# Patient Record
Sex: Female | Born: 1940 | Race: White | Hispanic: No | Marital: Married | Smoking: Former smoker
Health system: Southern US, Community
[De-identification: ages and names within clinical notes are randomized; demographics above are authoritative.]

## PROBLEM LIST (undated history)

## (undated) DIAGNOSIS — F32A Depression, unspecified: Secondary | ICD-10-CM

## (undated) DIAGNOSIS — I10 Essential (primary) hypertension: Secondary | ICD-10-CM

## (undated) DIAGNOSIS — E7800 Pure hypercholesterolemia, unspecified: Secondary | ICD-10-CM

## (undated) DIAGNOSIS — F419 Anxiety disorder, unspecified: Secondary | ICD-10-CM

## (undated) DIAGNOSIS — T7840XA Allergy, unspecified, initial encounter: Secondary | ICD-10-CM

## (undated) HISTORY — DX: Anxiety disorder, unspecified: F41.9

## (undated) HISTORY — PX: TONSILLECTOMY: SUR1361

## (undated) HISTORY — PX: JOINT REPLACEMENT: SHX530

## (undated) HISTORY — PX: CHOLECYSTECTOMY: SHX55

## (undated) HISTORY — DX: Allergy, unspecified, initial encounter: T78.40XA

## (undated) HISTORY — PX: TOTAL HIP ARTHROPLASTY: SHX124

---

## 2019-08-19 ENCOUNTER — Other Ambulatory Visit (HOSPITAL_COMMUNITY): Payer: Self-pay | Admitting: Family Medicine

## 2019-08-19 DIAGNOSIS — Z780 Asymptomatic menopausal state: Secondary | ICD-10-CM

## 2019-08-19 DIAGNOSIS — Z1231 Encounter for screening mammogram for malignant neoplasm of breast: Secondary | ICD-10-CM

## 2019-09-12 ENCOUNTER — Ambulatory Visit (HOSPITAL_COMMUNITY): Payer: Medicare HMO

## 2019-09-15 ENCOUNTER — Ambulatory Visit (HOSPITAL_COMMUNITY)
Admission: RE | Admit: 2019-09-15 | Discharge: 2019-09-15 | Disposition: A | Discharge status: Home / Self Care | Payer: Medicare HMO | Source: Ambulatory Visit | Attending: Family Medicine | Admitting: Family Medicine

## 2019-09-15 ENCOUNTER — Other Ambulatory Visit: Payer: Self-pay

## 2019-09-15 ENCOUNTER — Encounter (HOSPITAL_COMMUNITY): Payer: Self-pay

## 2019-09-15 DIAGNOSIS — Z1231 Encounter for screening mammogram for malignant neoplasm of breast: Secondary | ICD-10-CM | POA: Insufficient documentation

## 2019-09-15 DIAGNOSIS — Z780 Asymptomatic menopausal state: Secondary | ICD-10-CM | POA: Diagnosis present

## 2019-10-25 ENCOUNTER — Inpatient Hospital Stay
Admission: RE | Admit: 2019-10-25 | Discharge: 2019-10-25 | Disposition: A | Discharge status: Home / Self Care | Payer: Self-pay | Source: Ambulatory Visit | Attending: Family Medicine | Admitting: Family Medicine

## 2019-10-25 ENCOUNTER — Other Ambulatory Visit (HOSPITAL_COMMUNITY): Payer: Self-pay | Admitting: Family Medicine

## 2019-10-25 DIAGNOSIS — Z1231 Encounter for screening mammogram for malignant neoplasm of breast: Secondary | ICD-10-CM

## 2020-06-04 DIAGNOSIS — F419 Anxiety disorder, unspecified: Secondary | ICD-10-CM | POA: Diagnosis not present

## 2020-06-04 DIAGNOSIS — M19041 Primary osteoarthritis, right hand: Secondary | ICD-10-CM | POA: Diagnosis not present

## 2020-06-04 DIAGNOSIS — M25511 Pain in right shoulder: Secondary | ICD-10-CM | POA: Diagnosis not present

## 2020-09-14 ENCOUNTER — Emergency Department (HOSPITAL_COMMUNITY)
Admission: EM | Admit: 2020-09-14 | Discharge: 2020-09-14 | Disposition: A | Discharge status: Home / Self Care | Payer: Medicare HMO | Attending: Emergency Medicine | Admitting: Emergency Medicine

## 2020-09-14 ENCOUNTER — Emergency Department (HOSPITAL_COMMUNITY): Payer: Medicare HMO

## 2020-09-14 ENCOUNTER — Other Ambulatory Visit: Payer: Self-pay

## 2020-09-14 ENCOUNTER — Encounter (HOSPITAL_COMMUNITY): Payer: Self-pay | Admitting: *Deleted

## 2020-09-14 DIAGNOSIS — Z87891 Personal history of nicotine dependence: Secondary | ICD-10-CM | POA: Insufficient documentation

## 2020-09-14 DIAGNOSIS — R079 Chest pain, unspecified: Secondary | ICD-10-CM | POA: Diagnosis not present

## 2020-09-14 DIAGNOSIS — R0789 Other chest pain: Secondary | ICD-10-CM | POA: Insufficient documentation

## 2020-09-14 DIAGNOSIS — I10 Essential (primary) hypertension: Secondary | ICD-10-CM | POA: Diagnosis not present

## 2020-09-14 HISTORY — DX: Essential (primary) hypertension: I10

## 2020-09-14 HISTORY — DX: Depression, unspecified: F32.A

## 2020-09-14 HISTORY — DX: Pure hypercholesterolemia, unspecified: E78.00

## 2020-09-14 LAB — CBC WITH DIFFERENTIAL/PLATELET
Abs Immature Granulocytes: 0.03 10*3/uL (ref 0.00–0.07)
Basophils Absolute: 0.1 10*3/uL (ref 0.0–0.1)
Basophils Relative: 1 %
Eosinophils Absolute: 0.2 10*3/uL (ref 0.0–0.5)
Eosinophils Relative: 3 %
HCT: 44.4 % (ref 36.0–46.0)
Hemoglobin: 14.5 g/dL (ref 12.0–15.0)
Immature Granulocytes: 0 %
Lymphocytes Relative: 19 %
Lymphs Abs: 1.5 10*3/uL (ref 0.7–4.0)
MCH: 33 pg (ref 26.0–34.0)
MCHC: 32.7 g/dL (ref 30.0–36.0)
MCV: 100.9 fL — ABNORMAL HIGH (ref 80.0–100.0)
Monocytes Absolute: 0.8 10*3/uL (ref 0.1–1.0)
Monocytes Relative: 10 %
Neutro Abs: 5.4 10*3/uL (ref 1.7–7.7)
Neutrophils Relative %: 67 %
Platelets: 316 10*3/uL (ref 150–400)
RBC: 4.4 MIL/uL (ref 3.87–5.11)
RDW: 12.8 % (ref 11.5–15.5)
WBC: 8.1 10*3/uL (ref 4.0–10.5)
nRBC: 0 % (ref 0.0–0.2)

## 2020-09-14 LAB — COMPREHENSIVE METABOLIC PANEL
ALT: 22 U/L (ref 0–44)
AST: 18 U/L (ref 15–41)
Albumin: 3.9 g/dL (ref 3.5–5.0)
Alkaline Phosphatase: 60 U/L (ref 38–126)
Anion gap: 7 (ref 5–15)
BUN: 13 mg/dL (ref 8–23)
CO2: 27 mmol/L (ref 22–32)
Calcium: 9.5 mg/dL (ref 8.9–10.3)
Chloride: 105 mmol/L (ref 98–111)
Creatinine, Ser: 0.87 mg/dL (ref 0.44–1.00)
GFR, Estimated: >60 mL/min (ref >60)
Glucose, Bld: 109 mg/dL — ABNORMAL HIGH (ref 70–99)
Potassium: 3.9 mmol/L (ref 3.5–5.1)
Sodium: 139 mmol/L (ref 135–145)
Total Bilirubin: 0.4 mg/dL (ref 0.3–1.2)
Total Protein: 7 g/dL (ref 6.5–8.1)

## 2020-09-14 LAB — TROPONIN I (HIGH SENSITIVITY)
Troponin I (High Sensitivity): 3 ng/L (ref <18)
Troponin I (High Sensitivity): 3 ng/L (ref <18)

## 2020-09-14 LAB — LIPASE, BLOOD: Lipase: 23 U/L (ref 11–51)

## 2020-09-14 NOTE — ED Triage Notes (Signed)
Pt c/o mid to left sided chest pain x 1 week. Denies any new SOB since the chest pain started. Pain is intermittent.

## 2020-09-14 NOTE — ED Notes (Signed)
X-ray at bedside

## 2020-09-14 NOTE — Discharge Instructions (Addendum)
Follow-up with Dr. Wyline Mood in the next week or 2.  Continue taking your aspirin and return if any problems prior to that

## 2020-09-14 NOTE — ED Provider Notes (Signed)
[facility] EMERGENCY DEPARTMENT Provider Note   CSN: [id number] Arrival date & time: 09/14/20  0749     History Chief Complaint  Patient presents with   Chest Pain    Gina Salazar is a 80 y.o. female.  Patient states for the last day she has been having episodes of left-sided chest discomfort lasting just a few seconds more of a stinging pain.  She is not having any pain now.  It is not related to any exertion  The history is provided by the patient. No language interpreter was used.  Chest Pain Pain location:  L chest Pain quality: aching   Pain radiates to:  Does not radiate Pain severity:  Mild Onset quality:  Sudden Duration: 15 seconds. Timing:  Intermittent Progression:  Unchanged Chronicity:  New Context: not breathing   Associated symptoms: no abdominal pain, no back pain, no cough, no fatigue and no headache       Past Medical History:  Diagnosis Date   Depression    High cholesterol    Hypertension     There are no problems to display for this patient.   Past Surgical History:  Procedure Laterality Date   CHOLECYSTECTOMY     TONSILLECTOMY       OB History   No obstetric history on file.     Family History  Problem Relation Age of Onset   Breast cancer Maternal Aunt    Breast cancer Maternal Uncle     Social History   Tobacco Use   Smoking status: Former    Years: 4.00    Pack years: 0.00    Types: Cigarettes   Smokeless tobacco: Never  Vaping Use   Vaping Use: Never used  Substance Use Topics   Alcohol use: Never   Drug use: Never    Home Medications Prior to Admission medications   Not on File    Allergies    Patient has no known allergies.  Review of Systems   Review of Systems  Constitutional:  Negative for appetite change and fatigue.  HENT:  Negative for congestion, ear discharge and sinus pressure.   Eyes:  Negative for discharge.  Respiratory:  Negative for cough.   Cardiovascular:  Positive for chest  pain.  Gastrointestinal:  Negative for abdominal pain and diarrhea.  Genitourinary:  Negative for frequency and hematuria.  Musculoskeletal:  Negative for back pain.  Skin:  Negative for rash.  Neurological:  Negative for seizures and headaches.  Psychiatric/Behavioral:  Negative for hallucinations.    Physical Exam Updated Vital Signs BP 127/68   Pulse 79   Temp 99.3 F (37.4 C) (Oral)   Resp 17   Ht 5\' 3"  (1.6 m)   Wt 98.9 kg   SpO2 96%   BMI 38.62 kg/m   Physical Exam Vitals and nursing note reviewed.  Constitutional:      Appearance: She is well-developed.  HENT:     Head: Normocephalic.     Nose: Nose normal.  Eyes:     General: No scleral icterus.    Conjunctiva/sclera: Conjunctivae normal.  Neck:     Thyroid: No thyromegaly.  Cardiovascular:     Rate and Rhythm: Normal rate and regular rhythm.     Heart sounds: No murmur heard.   No friction rub. No gallop.  Pulmonary:     Breath sounds: No stridor. No wheezing or rales.  Chest:     Chest wall: No tenderness.  Abdominal:     General: There  is no distension.     Tenderness: There is no abdominal tenderness. There is no rebound.  Musculoskeletal:        General: Normal range of motion.     Cervical back: Neck supple.  Lymphadenopathy:     Cervical: No cervical adenopathy.  Skin:    Findings: No erythema or rash.  Neurological:     Mental Status: She is alert and oriented to person, place, and time.     Motor: No abnormal muscle tone.     Coordination: Coordination normal.  Psychiatric:        Behavior: Behavior normal.    ED Results / Procedures / Treatments   Labs (all labs ordered are listed, but only abnormal results are displayed) Labs Reviewed  CBC WITH DIFFERENTIAL/PLATELET - Abnormal; Notable for the following components:      Result Value   MCV 100.9 (*)    All other components within normal limits  COMPREHENSIVE METABOLIC PANEL - Abnormal; Notable for the following components:    Glucose, Bld 109 (*)    All other components within normal limits  LIPASE, BLOOD  TROPONIN I (HIGH SENSITIVITY)  TROPONIN I (HIGH SENSITIVITY)    EKG EKG Interpretation  Date/Time:  Friday September 14 2020 08:00:05 EDT Ventricular Rate:  91 PR Interval:  159 QRS Duration: 84 QT Interval:  344 QTC Calculation: 424 R Axis:   -13 Text Interpretation: Sinus rhythm Consider left atrial enlargement Low voltage, precordial leads Consider anterior infarct Confirmed by Bethann Berkshire [phone]) on 09/14/2020 9:09:45 AM  Radiology DG Chest Port 1 View  Result Date: 09/14/2020 CLINICAL DATA:  Pain EXAM: PORTABLE CHEST 1 VIEW COMPARISON:  None. FINDINGS: Mild, likely chronic interstitial changes. No consolidation or edema. No pleural effusion. No pneumothorax. Cardiomediastinal contours are within normal limits with normal heart size. IMPRESSION: No acute process in the chest. Electronically Signed   By: Guadlupe Spanish M.D.   On: 09/14/2020 08:32    Procedures Procedures   Medications Ordered in ED Medications - No data to display  ED Course  I have reviewed the triage vital signs and the nursing notes.  Pertinent labs & imaging results that were available during my care of the patient were reviewed by me and considered in my medical decision making (see chart for details).    MDM Rules/Calculators/A&P                         Patient with atypical chest pain.  EKG and troponins chest x-ray and the rest of her labs are all unremarkable.  I do not believe this is coronary artery disease.  Most likely it is a peripheral nervous inflamed.  Her pain only lasts for a few seconds when she gets it.  Patient has been referred to cardiology for evaluation Final Clinical Impression(s) / ED Diagnoses Final diagnoses:  Atypical chest pain    Rx / DC Orders ED Discharge Orders     None        Bethann Berkshire, MD 09/14/20 1115

## 2020-09-26 DIAGNOSIS — F419 Anxiety disorder, unspecified: Secondary | ICD-10-CM | POA: Diagnosis not present

## 2020-09-26 DIAGNOSIS — E785 Hyperlipidemia, unspecified: Secondary | ICD-10-CM | POA: Diagnosis not present

## 2020-09-26 DIAGNOSIS — F3341 Major depressive disorder, recurrent, in partial remission: Secondary | ICD-10-CM | POA: Diagnosis not present

## 2020-09-26 DIAGNOSIS — I10 Essential (primary) hypertension: Secondary | ICD-10-CM | POA: Diagnosis not present

## 2020-10-11 ENCOUNTER — Encounter: Payer: Self-pay | Admitting: Cardiology

## 2020-10-11 ENCOUNTER — Ambulatory Visit: Payer: Medicare HMO | Admitting: Cardiology

## 2020-10-11 ENCOUNTER — Other Ambulatory Visit: Payer: Self-pay

## 2020-10-11 VITALS — BP 110/70 | HR 92 | Ht 63.0 in | Wt 216.0 lb

## 2020-10-11 DIAGNOSIS — R0789 Other chest pain: Secondary | ICD-10-CM

## 2020-10-11 NOTE — Progress Notes (Signed)
     Clinical Summary Gina Salazar is a 80 y.o.female seen today as a new patient for the following medical problems  Chest pain - from ER notes thought to be atypical chest pain - trops neg x 2, EKG NSR no ischemic changes - CXR no acute process  - pain midchest x 1 week. Moderate pressing like feeling. Would occur at rest or with exertion. No other associated symptoms. Would last 1-2 seconds. Not positional. Few times a day.  - no recurrence since ER visit - sedentary lifestyle, does mild housework. Chronic SOB with activities. Limited by leg weakness  CAD risk factors: HTN, hyperlipidemia, former smoker x 6 years.    Past Medical History:  Diagnosis Date   Depression    High cholesterol    Hypertension      No Known Allergies   No current outpatient medications on file.   No current facility-administered medications for this visit.     Past Surgical History:  Procedure Laterality Date   CHOLECYSTECTOMY     TONSILLECTOMY       No Known Allergies    Family History  Problem Relation Age of Onset   Breast cancer Maternal Aunt    Breast cancer Maternal Uncle      Social History Gina Salazar reports that she has quit smoking. Her smoking use included cigarettes. She has never used smokeless tobacco. Gina Salazar reports no history of alcohol use.   Review of Systems CONSTITUTIONAL: No weight loss, fever, chills, weakness or fatigue.  HEENT: Eyes: No visual loss, blurred vision, double vision or yellow sclerae.No hearing loss, sneezing, congestion, runny nose or sore throat.  SKIN: No rash or itching.  CARDIOVASCULAR: per hpi RESPIRATORY: No shortness of breath, cough or sputum.  GASTROINTESTINAL: No anorexia, nausea, vomiting or diarrhea. No abdominal pain or blood.  GENITOURINARY: No burning on urination, no polyuria NEUROLOGICAL: No headache, dizziness, syncope, paralysis, ataxia, numbness or tingling in the extremities. No change in bowel or bladder  control.  MUSCULOSKELETAL: No muscle, back pain, joint pain or stiffness.  LYMPHATICS: No enlarged nodes. No history of splenectomy.  PSYCHIATRIC: No history of depression or anxiety.  ENDOCRINOLOGIC: No reports of sweating, cold or heat intolerance. No polyuria or polydipsia.  Marland Kitchen   Physical Examination Today's Vitals   10/11/20 0906  BP: 110/70  Pulse: 92  SpO2: 96%  Weight: 216 lb (98 kg)  Height: 5\' 3"  (1.6 m)   Body mass index is 38.26 kg/m.  Gen: resting comfortably, no acute distress HEENT: no scleral icterus, pupils equal round and reactive, no palptable cervical adenopathy,  CV: RRR, no m/r/g, no jvd Resp: Clear to auscultation bilaterally GI: abdomen is soft, non-tender, non-distended, normal bowel sounds, no hepatosplenomegaly MSK: extremities are warm, no edema.  Skin: warm, no rash Neuro:  no focal deficits Psych: appropriate affect   Assessment and Plan  Chest pain -noncardiac chest pain that has resolved. Negative ER workup - no plans for additional cardiac testing at this time - continue risk factor modificiation  May f/u as needed        June, M.D.

## 2020-10-11 NOTE — Patient Instructions (Signed)
Medication Instructions:  Your physician recommends that you continue on your current medications as directed. Please refer to the Current Medication list given to you today.  *If you need a refill on your cardiac medications before your next appointment, please call your pharmacy*   Lab Work: None If you have labs (blood work) drawn today and your tests are completely normal, you will receive your results only by: MyChart Message (if you have MyChart) OR A paper copy in the mail If you have any lab test that is abnormal or we need to change your treatment, we will call you to review the results.   Testing/Procedures: None   Follow-Up: At [facility], you and your health needs are our priority.  As part of our continuing mission to provide you with exceptional heart care, we have created designated Provider Care Teams.  These Care Teams include your primary Cardiologist (physician) and Advanced Practice Providers (APPs -  Physician Assistants and Nurse Practitioners) who all work together to provide you with the care you need, when you need it.  We recommend signing up for the patient portal called "MyChart".  Sign up information is provided on this After Visit Summary.  MyChart is used to connect with patients for Virtual Visits (Telemedicine).  Patients are able to view lab/test results, encounter notes, upcoming appointments, etc.  Non-urgent messages can be sent to your provider as well.   To learn more about what you can do with MyChart, go to [email or web address].    Your next appointment:   Follow Up: AS NEEDED   Other Instructions    

## 2020-12-12 DIAGNOSIS — I10 Essential (primary) hypertension: Secondary | ICD-10-CM | POA: Diagnosis not present

## 2020-12-12 DIAGNOSIS — E559 Vitamin D deficiency, unspecified: Secondary | ICD-10-CM | POA: Diagnosis not present

## 2020-12-12 DIAGNOSIS — E782 Mixed hyperlipidemia: Secondary | ICD-10-CM | POA: Diagnosis not present

## 2020-12-13 LAB — LAB REPORT - SCANNED: EGFR: 64

## 2020-12-26 DIAGNOSIS — I10 Essential (primary) hypertension: Secondary | ICD-10-CM | POA: Diagnosis not present

## 2020-12-26 DIAGNOSIS — F3341 Major depressive disorder, recurrent, in partial remission: Secondary | ICD-10-CM | POA: Diagnosis not present

## 2020-12-26 DIAGNOSIS — E6609 Other obesity due to excess calories: Secondary | ICD-10-CM | POA: Diagnosis not present

## 2020-12-26 DIAGNOSIS — E785 Hyperlipidemia, unspecified: Secondary | ICD-10-CM | POA: Diagnosis not present

## 2021-03-27 ENCOUNTER — Other Ambulatory Visit (HOSPITAL_COMMUNITY): Payer: Self-pay | Admitting: Family Medicine

## 2021-03-27 DIAGNOSIS — M79644 Pain in right finger(s): Secondary | ICD-10-CM | POA: Diagnosis not present

## 2021-03-27 DIAGNOSIS — M810 Age-related osteoporosis without current pathological fracture: Secondary | ICD-10-CM | POA: Diagnosis not present

## 2021-03-27 DIAGNOSIS — Z1231 Encounter for screening mammogram for malignant neoplasm of breast: Secondary | ICD-10-CM

## 2021-03-27 DIAGNOSIS — E559 Vitamin D deficiency, unspecified: Secondary | ICD-10-CM | POA: Diagnosis not present

## 2021-03-27 DIAGNOSIS — Z9181 History of falling: Secondary | ICD-10-CM | POA: Diagnosis not present

## 2021-04-17 ENCOUNTER — Other Ambulatory Visit: Payer: Self-pay

## 2021-04-17 ENCOUNTER — Ambulatory Visit (HOSPITAL_COMMUNITY)
Admission: RE | Admit: 2021-04-17 | Discharge: 2021-04-17 | Disposition: A | Discharge status: Home / Self Care | Payer: Medicare HMO | Source: Ambulatory Visit | Attending: Family Medicine | Admitting: Family Medicine

## 2021-04-17 DIAGNOSIS — Z1231 Encounter for screening mammogram for malignant neoplasm of breast: Secondary | ICD-10-CM | POA: Insufficient documentation

## 2021-07-24 DIAGNOSIS — F3341 Major depressive disorder, recurrent, in partial remission: Secondary | ICD-10-CM | POA: Diagnosis not present

## 2021-07-24 DIAGNOSIS — F419 Anxiety disorder, unspecified: Secondary | ICD-10-CM | POA: Diagnosis not present

## 2021-07-24 DIAGNOSIS — E785 Hyperlipidemia, unspecified: Secondary | ICD-10-CM | POA: Diagnosis not present

## 2021-07-24 DIAGNOSIS — I10 Essential (primary) hypertension: Secondary | ICD-10-CM | POA: Diagnosis not present

## 2021-11-20 ENCOUNTER — Other Ambulatory Visit (HOSPITAL_COMMUNITY): Payer: Self-pay | Admitting: Nurse Practitioner

## 2021-11-20 DIAGNOSIS — M810 Age-related osteoporosis without current pathological fracture: Secondary | ICD-10-CM | POA: Diagnosis not present

## 2021-11-20 DIAGNOSIS — Z780 Asymptomatic menopausal state: Secondary | ICD-10-CM

## 2021-11-20 DIAGNOSIS — N393 Stress incontinence (female) (male): Secondary | ICD-10-CM | POA: Diagnosis not present

## 2021-11-20 DIAGNOSIS — Z9181 History of falling: Secondary | ICD-10-CM | POA: Diagnosis not present

## 2021-11-20 DIAGNOSIS — F419 Anxiety disorder, unspecified: Secondary | ICD-10-CM | POA: Diagnosis not present

## 2021-11-25 ENCOUNTER — Ambulatory Visit (HOSPITAL_COMMUNITY)
Admission: RE | Admit: 2021-11-25 | Discharge: 2021-11-25 | Disposition: A | Discharge status: Home / Self Care | Payer: Medicare HMO | Source: Ambulatory Visit | Attending: Nurse Practitioner | Admitting: Nurse Practitioner

## 2021-11-25 DIAGNOSIS — M810 Age-related osteoporosis without current pathological fracture: Secondary | ICD-10-CM | POA: Diagnosis not present

## 2021-11-25 DIAGNOSIS — Z780 Asymptomatic menopausal state: Secondary | ICD-10-CM | POA: Diagnosis not present

## 2021-11-25 DIAGNOSIS — M85852 Other specified disorders of bone density and structure, left thigh: Secondary | ICD-10-CM | POA: Diagnosis not present

## 2021-12-13 DIAGNOSIS — E782 Mixed hyperlipidemia: Secondary | ICD-10-CM | POA: Diagnosis not present

## 2021-12-13 DIAGNOSIS — I10 Essential (primary) hypertension: Secondary | ICD-10-CM | POA: Diagnosis not present

## 2021-12-13 DIAGNOSIS — E559 Vitamin D deficiency, unspecified: Secondary | ICD-10-CM | POA: Diagnosis not present

## 2021-12-18 ENCOUNTER — Ambulatory Visit (HOSPITAL_COMMUNITY): Payer: Medicare HMO | Attending: Family Medicine

## 2021-12-18 DIAGNOSIS — M25552 Pain in left hip: Secondary | ICD-10-CM | POA: Insufficient documentation

## 2021-12-18 DIAGNOSIS — R262 Difficulty in walking, not elsewhere classified: Secondary | ICD-10-CM | POA: Diagnosis not present

## 2021-12-18 DIAGNOSIS — R2689 Other abnormalities of gait and mobility: Secondary | ICD-10-CM | POA: Diagnosis not present

## 2021-12-18 NOTE — Therapy (Signed)
OUTPATIENT PHYSICAL THERAPY LOWER EXTREMITY EVALUATION   Patient Name: Gina Salazar MRN: [id number] DOB:10/30/1940, 81 y.o., female Today's Date: 12/18/2021   PT End of Session - 12/18/21 1014     Visit Number 1    Number of Visits 6    Date for PT Re-Evaluation 01/29/22    Authorization Type [employer] Medicare HMO, no VL; no auth    Progress Note Due on Visit 10    PT Start Time 1014    PT Stop Time 1100    PT Time Calculation (min) 46 min             Past Medical History:  Diagnosis Date   Depression    High cholesterol    Hypertension    Past Surgical History:  Procedure Laterality Date   CHOLECYSTECTOMY     TONSILLECTOMY     There are no problems to display for this patient.   PCP:  John Giovanni, MD     REFERRING PROVIDER: PT eval/tx for hx of falling, to improve gait mobility and strength, lt hip pain per Dillard Essex, NP   REFERRING DIAG: PT eval/tx for hx of falling, to improve gait mobility and strength, lt hip pain per Dillard Essex, NP   THERAPY DIAG:  Difficulty in walking, not elsewhere classified  Other abnormalities of gait and mobility  Pain in left hip  Rationale for Evaluation and Treatment Rehabilitation  ONSET DATE: years  SUBJECTIVE:   SUBJECTIVE STATEMENT: My biggest issue is balance. Moved here from [email or web address]; my hips hurt; don't want to fall anymore; left hip pain limits prolonged standing or walking; has to cook in increments; sleep on that side  PERTINENT HISTORY: R THA 4 years ago Has some hand and shoulder pain  PAIN:  Are you having pain? Yes: NPRS scale: 4/10 Pain location: left hip Pain description: achey Aggravating factors: standing, walking prolonged period of time Relieving factors: Advil  PRECAUTIONS: None  WEIGHT BEARING RESTRICTIONS No  FALLS:  Has patient fallen in last 6 months? No  LIVING ENVIRONMENT: Lives with: lives with their spouse Lives in: House/apartment Stairs: Yes:  External: 2 steps; none Has following equipment at home: Single point cane, Environmental consultant - 4 wheeled, Tour manager, and Grab bars  OCCUPATION: retired  PLOF: Independent  PATIENT GOALS better balance   OBJECTIVE:   DIAGNOSTIC FINDINGS:   EXAM: DUAL X-RAY ABSORPTIOMETRY (DXA) FOR BONE MINERAL DENSITY   IMPRESSION: Your patient Gina Salazar completed a BMD test on 11/25/2021 using the [employer] DXA System (software version: 14.10) manufactured by [employer]. The following summarizes the results of our evaluation. Technologist: [facility] PATIENT BIOGRAPHICAL: Name: Gina, Salazar Patient ID: [id number] Birth Date: [birth year] Height: 63.0 in. Gender: Female Exam Date: 11/25/2021 Weight: 216.0 lbs. Indications: Caucasian, Follow up Osteoporosis, Low Calcium Intake, Post Menopausal Fractures: Treatments: Asprin, Fosamax, Vitamin D DENSITOMETRY RESULTS: Site         Region     Measured Date Measured Age WHO Classification Young Adult T-score BMD         %Change vs. Previous Significant Change (*) Left Femur Neck 13/25/[age] 78.9 Oste[age] -1.2 0.870 g/cm2 8.6% Yes Left Femur Neck 09[age]15/2019 76.7 Osteopenia -1.7 0.801 g/cm2 - -   Left Femur Total 11/25/2023 78.9 Osteopenia -1.2 0.856 g/cm2 9.2% Yes Left Femur Total 09/15/2019 76.7 Osteopenia -1.8 0.784 g/cm2 - -   Left Forearm Radius 33% 11/25/2023 78.9 Osteoporosis -3.0 0.497 g/cm2 4.6% - Left Forearm Radius 33% 09/15/2019 76.7 Osteoporosis -3.3 0.475 g/cm2 - -  ASSESSMENT: The BMD measured at Forearm Radius 33% is 0.497 g/cm2 with a T-score of -3.0. This patient is considered osteoporotic according to World Health Organization [facility]) criteria. The scan quality is good. Compared with the prior study on 09/15/19, the BMD of the lt. total hip shows a statistically significant increase. Lumbar spine was excluded due to advanced degenerative changes. Rt. hip excluded due to surgical repair.   World Environmental consultant [facility]) criteria for post-menopausal, Caucasian Women: Normal:       T-score at or above -1 SD Osteopenia:   T-score between -1 and -2.5 SD Osteoporosis: T-score at or below -2.5 SD   RECOMMENDATIONS: 1. All patients should optimize calcium and vitamin D intake. 2. Consider FDA-approved medical therapies in postmenopausal women and med aged 51 years and older, based on the following: a. A hip or vertebral (clinical or morphometric) fracture b. T-score< -2.5 at the femoral neck or spine after appropriate evaluation to exclude secondary causes c. Low bone mass (T-score between -1.0 and -2.5 at the femoral neck or spine) and a 10-year probability of a hip fracture > 3% or a 10-year probability of a major osteoporosis-related fracture > 20% based on the [place]-adapted WHO algorithm d. Clinician judgment and/or patient preferences may indicate treatment for people with 10-year fracture probabilities above or below these levels   FOLLOW-UP: Patients with diagnosis of osteoporosis or at high risk for fracture should have regular bone mineral density tests. For patients eligible for Medicare, routine testing is allowed once every 2 years. The testing frequency can be increased to one year for patients who have rapidly progressing disease, those who are receiving or discontinuing medical therapy to restore bone mass, or have additional risk factors.   I have reviewed this report, and agree with the above findings.   [facility]     PATIENT SURVEYS:  FOTO 52  COGNITION:  Overall cognitive status: Within functional limits for tasks assessed     SENSATION: Sometimes hands go to sleep at night   LOWER EXTREMITY MMT:  MMT Right eval Left eval  Hip flexion 4 4  Hip extension 4- 4-  Hip abduction 4- 4  Hip adduction    Hip internal rotation    Hip external rotation    Knee flexion 4+ 4  Knee extension 5 5  Ankle dorsiflexion 5 5  Ankle plantarflexion     Ankle inversion    Ankle eversion     (Blank rows = not tested)    FUNCTIONAL TESTS:  5 times sit to stand: 17.35 Timed up and go (TUG): not tested 2 minute walk test: 365 ft  GAIT: Distance walked: 365 ft Assistive device utilized: Single point cane Level of assistance: Modified independence Comments: R ankle instabilty    TODAY'S TREATMENT: Physical therapy evaluation and HEP instruction   PATIENT EDUCATION:  Education details: Patient educated on exam findings, POC, scope of PT, HEP. Person educated: Patient Education method: Explanation, Demonstration, and Handouts Education comprehension: verbalized understanding, returned demonstration, verbal cues required, and tactile cues required   HOME EXERCISE PROGRAM: Access Code: [email or web address] URL: [email or web address] Date: 12/18/2021 Prepared by: AP - Rehab  Exercises - Supine Bridge  - 2 x daily - 7 x weekly - 1 sets - 10 reps - Hooklying Clamshell with Resistance  - 2 x daily - 7 x weekly - 1 sets - 10 reps - Single Leg Stance with Support  - 2 x daily - 7 x weekly - 1 sets - 10  reps - 5-10 sec hold - Standing Tandem Balance with Counter Support  - 2 x daily - 7 x weekly - 1 sets - 10 reps - 5-10 sec hold  ASSESSMENT:  CLINICAL IMPRESSION: Patient is a 81 y.o. female who was seen today for physical therapy evaluation and treatment for PT eval/tx for hx of falling, to improve gait mobility and strength, lt hip pain per Carlis Abbott, NP . Patient presents on evaluation with weakness in her legs, impaired balance using a cane for ambulation, impaired perceived functional mobility per FOTO score and pain that limits tolerance for prolonged standing and walking all of which negatively impact her ability to stand to cook; to walk for fitness and shopping in community and disturb her sleep.  Patient will benefit from continued skilled therapy services  to address deficits and promote return to optimal function.        OBJECTIVE IMPAIRMENTS Abnormal gait, cardiopulmonary status limiting activity, decreased activity tolerance, decreased balance, decreased endurance, decreased knowledge of condition, decreased knowledge of use of DME, decreased mobility, difficulty walking, decreased ROM, decreased strength, hypomobility, increased fascial restrictions, impaired perceived functional ability, impaired flexibility, and pain.   ACTIVITY LIMITATIONS carrying, lifting, bending, standing, squatting, sleeping, stairs, transfers, bed mobility, continence, bathing, locomotion level, and caring for others  PARTICIPATION LIMITATIONS: meal prep, cleaning, laundry, driving, shopping, community activity, and yard work  [place], Past/current experiences, and Time since onset of injury/illness/exacerbation are also affecting patient's functional outcome.   REHAB POTENTIAL: Good  CLINICAL DECISION MAKING: Evolving/moderate complexity  EVALUATION COMPLEXITY: Moderate   GOALS: Goals reviewed with patient? No  SHORT TERM GOALS: Target date: 01/08/2022   patient will be independent with initial HEP  Baseline: Goal status: INITIAL  2.  Patient will improve 5 x STS score from 17.35 sec to 15 sec to demonstrate improved functional mobility and increased lower extremity strength.  Baseline:  Goal status: INITIAL    LONG TERM GOALS: Target date: 01/29/2022   Patient will be independent in self management strategies to improve quality of life and functional outcomes.  Baseline:  Goal status: INITIAL  2.  Patient will improve 5 x STS score from 17.23 sec to 13 sec to demonstrate improved functional mobility and increased lower extremity strength.  Baseline:  Goal status: INITIAL  3.  Patient will improve FOTO score to predicted value to demonstrate improved functional mobility  Baseline: 52 Goal status: INITIAL  4.  Patient will report at least 50% improvement in overall symptoms and/or  function to demonstrate improved functional mobility  Baseline:  Goal status: INITIAL  5.   Patient will increase bilateral lower extremity MMTs to 4+-5/5 to promote return to ambulation community distances with minimal deviation.   Baseline: see above Goal status: INITIAL  PLAN: PT FREQUENCY: 1x/week  PT DURATION: 6 weeks  PLANNED INTERVENTIONS: Therapeutic exercises, Therapeutic activity, Neuromuscular re-education, Balance training, Gait training, Patient/Family education, Joint manipulation, Joint mobilization, Stair training, Orthotic/Fit training, DME instructions, Aquatic Therapy, Dry Needling, Electrical stimulation, Spinal manipulation, Spinal mobilization, Cryotherapy, Moist heat, Compression bandaging, scar mobilization, Splintting, Taping, Traction, Ultrasound, Ionotophoresis 4mg /ml Dexamethasone, and Manual therapy  PLAN FOR NEXT SESSION: Review of HEP and goals.   11:04 AM, 12/18/21 Mendy Chou Small Jamaiyah Pyle MPT [facility] [place] [phone]

## 2021-12-20 DIAGNOSIS — Z9181 History of falling: Secondary | ICD-10-CM | POA: Diagnosis not present

## 2021-12-20 DIAGNOSIS — N393 Stress incontinence (female) (male): Secondary | ICD-10-CM | POA: Diagnosis not present

## 2021-12-20 DIAGNOSIS — M810 Age-related osteoporosis without current pathological fracture: Secondary | ICD-10-CM | POA: Diagnosis not present

## 2021-12-20 DIAGNOSIS — F419 Anxiety disorder, unspecified: Secondary | ICD-10-CM | POA: Diagnosis not present

## 2021-12-25 ENCOUNTER — Ambulatory Visit (HOSPITAL_COMMUNITY): Payer: Medicare HMO | Admitting: Physical Therapy

## 2021-12-25 DIAGNOSIS — M25552 Pain in left hip: Secondary | ICD-10-CM

## 2021-12-25 DIAGNOSIS — R262 Difficulty in walking, not elsewhere classified: Secondary | ICD-10-CM | POA: Diagnosis not present

## 2021-12-25 DIAGNOSIS — R2689 Other abnormalities of gait and mobility: Secondary | ICD-10-CM | POA: Diagnosis not present

## 2021-12-25 NOTE — Therapy (Signed)
OUTPATIENT PHYSICAL THERAPY LOWER EXTREMITY TREATMENT   Patient Name: Gina Salazar MRN: [id number] DOB:[birth year], 81 y.o., female Today's Date: 12/25/2021   PT End of Session - 12/25/21 1122     Visit Number 2    Number of Visits 6    Date for PT Re-Evaluation 01/29/22    Authorization Type [employer] Medicare HMO, no VL; no auth    Progress Note Due on Visit 10    PT Start Time 1117    PT Stop Time 1155    PT Time Calculation (min) 38 min    Activity Tolerance Patient tolerated treatment well    Behavior During Therapy WFL for tasks assessed/performed             Past Medical History:  Diagnosis Date   Depression    High cholesterol    Hypertension    Past Surgical History:  Procedure Laterality Date   CHOLECYSTECTOMY     TONSILLECTOMY     There are no problems to display for this patient.   PCP:  John Giovanni, MD     REFERRING PROVIDER: PT eval/tx for hx of falling, to improve gait mobility and strength, lt hip pain per Dillard Essex, NP   REFERRING DIAG: PT eval/tx for hx of falling, to improve gait mobility and strength, lt hip pain per Dillard Essex, NP   THERAPY DIAG:  Difficulty in walking, not elsewhere classified  Other abnormalities of gait and mobility  Pain in left hip  Rationale for Evaluation and Treatment Rehabilitation  ONSET DATE: years  SUBJECTIVE:   SUBJECTIVE STATEMENT: Right hip is hurting today. HEP was ok, did most days. Most trouble with tandem balance.   PERTINENT HISTORY: R THA 4 years ago Has some hand and shoulder pain  PAIN:  Are you having pain? Yes: NPRS scale: 3/10 Pain location: left hip Pain description: achey Aggravating factors: standing, walking prolonged period of time Relieving factors: Advil  PRECAUTIONS: None  WEIGHT BEARING RESTRICTIONS No  FALLS:  Has patient fallen in last 6 months? No  LIVING ENVIRONMENT: Lives with: lives with their spouse Lives in: House/apartment Stairs: Yes:  External: 2 steps; none Has following equipment at home: Single point cane, Environmental consultant - 4 wheeled, Tour manager, and Grab bars  OCCUPATION: retired  PLOF: Independent  PATIENT GOALS better balance   OBJECTIVE:   DIAGNOSTIC FINDINGS:   EXAM: DUAL X-RAY ABSORPTIOMETRY (DXA) FOR BONE MINERAL DENSITY   IMPRESSION: Your patient Gina Salazar completed a BMD test on 11/25/2021 using the [employer] DXA System (software version: 14.10) manufactured by [employer]. The following summarizes the results of our evaluation. Technologist: [facility] PATIENT BIOGRAPHICAL: Name: Gina, Salazar Patient ID: [id number] Birth Date: 1940/04/27 Height: 63.0 in. Gender: Female Exam Date: 11/25/2021 Weight: 216.0 lbs. Indications: Caucasian, Follow up Osteoporosis, Low Calcium Intake, Post Menopausal Fractures: Treatments: Asprin, Fosamax, Vitamin D DENSITOMETRY RESULTS: Site         Region     Measured Date Measured Age WHO Classification Young Adult T-score BMD         %Change vs. Previous Significant Change (*) Left Femur Neck 11/25/2023 78.9 Oste[age] -1.2 0.870 g/cm2 8.6% Yes Left Femur Neck 09/15/2019 76.7 Osteopenia -1.7 0.801 g/cm2 - -   Left Femur Total 11/25/2021 78.9 Osteopenia -1.2 0.856 g/cm2 9.2% Yes Left Femur Total 09/15/2019 76.7 Osteopenia -1.8 0.784 g/cm2 - -   Left Forearm Radius 33% 11/25/2021 78.9 Osteoporosis -3.0 0.497 g/cm2 4.6% - Left Forearm Radius 33% 09/15/2019 76.7 Osteoporosis -3.3 0.475  g/cm2 - - ASSESSMENT: The BMD measured at Forearm Radius 33% is 0.497 g/cm2 with a T-score of -3.0. This patient is considered osteoporotic according to World Health Organization [facility]) criteria. The scan quality is good. Compared with the prior study on 09/15/19, the BMD of the lt. total hip shows a statistically significant increase. Lumbar spine was excluded due to advanced degenerative changes. Rt. hip excluded due to surgical repair.   World Environmental consultant [facility]) criteria for post-menopausal, Caucasian Women: Normal:       T-score at or above -1 SD Osteopenia:   T-score between -1 and -2.5 SD Osteoporosis: T-score at or below -2.5 SD   RECOMMENDATIONS: 1. All patients should optimize calcium and vitamin D intake. 2. Consider FDA-approved medical therapies in postmenopausal women and med aged 19 years and older, based on the following: a. A hip or vertebral (clinical or morphometric) fracture b. T-score< -2.5 at the femoral neck or spine after appropriate evaluation to exclude secondary causes c. Low bone mass (T-score between -1.0 and -2.5 at the femoral neck or spine) and a 10-year probability of a hip fracture > 3% or a 10-year probability of a major osteoporosis-related fracture > 20% based on the [place]-adapted WHO algorithm d. Clinician judgment and/or patient preferences may indicate treatment for people with 10-year fracture probabilities above or below these levels   FOLLOW-UP: Patients with diagnosis of osteoporosis or at high risk for fracture should have regular bone mineral density tests. For patients eligible for Medicare, routine testing is allowed once every 2 years. The testing frequency can be increased to one year for patients who have rapidly progressing disease, those who are receiving or discontinuing medical therapy to restore bone mass, or have additional risk factors.   I have reviewed this report, and agree with the above findings.   [facility]     PATIENT SURVEYS:  FOTO 52  COGNITION:  Overall cognitive status: Within functional limits for tasks assessed     SENSATION: Sometimes hands go to sleep at night   LOWER EXTREMITY MMT:  MMT Right eval Left eval  Hip flexion 4 4  Hip extension 4- 4-  Hip abduction 4- 4  Hip adduction    Hip internal rotation    Hip external rotation    Knee flexion 4+ 4  Knee extension 5 5  Ankle dorsiflexion 5 5  Ankle plantarflexion     Ankle inversion    Ankle eversion     (Blank rows = not tested)    FUNCTIONAL TESTS:  5 times sit to stand: 17.35 Timed up and go (TUG): not tested 2 minute walk test: 365 ft  GAIT: Distance walked: 365 ft Assistive device utilized: Single point cane Level of assistance: Modified independence Comments: R ankle instabilty    TODAY'S TREATMENT: 12/25/21 Bridge x 10  Clamshell 2 x 10 each SLR x 10 each  Heel raise x 10 Sit to stand no UE 1 x 10 Tandem stance 20 sec each Single leg stance 10 sec each    Physical therapy evaluation and HEP instruction   PATIENT EDUCATION:  Education details: Patient educated on exam findings, POC, scope of PT, HEP. Person educated: Patient Education method: Explanation, Demonstration, and Handouts Education comprehension: verbalized understanding, returned demonstration, verbal cues required, and tactile cues required   HOME EXERCISE PROGRAM: Access Code: [email or web address]  - Active Straight Leg Raise with Quad Set  - 2 x daily - 7 x weekly - 2 sets - 10 reps -  Clamshell  - 2 x daily - 7 x weekly - 2 sets - 10 reps - Heel Raises with Counter Support  - 2 x daily - 7 x weekly - 2 sets - 10 reps - Sit to Stand Without Arm Support  - 2 x daily - 7 x weekly - 1-2 sets - 10 reps  URL: [email or web address] Date: 12/18/2021 Prepared by: [facility] - Rehab  Exercises - Supine Bridge  - 2 x daily - 7 x weekly - 1 sets - 10 reps - Hooklying Clamshell with Resistance  - 2 x daily - 7 x weekly - 1 sets - 10 reps - Single Leg Stance with Support  - 2 x daily - 7 x weekly - 1 sets - 10 reps - 5-10 sec hold - Standing Tandem Balance with Counter Support  - 2 x daily - 7 x weekly - 1 sets - 10 reps - 5-10 sec hold  ASSESSMENT:  CLINICAL IMPRESSION: Reviewed goals and HEP. Patient tolerated session well today with minimal complaint. Most challenged with single leg balance. Progressed hip strengthening activity in sidelying and in standing.  Patient with good return overall, but did require verbal cues for correct form in full sidelying during clamshell activity. Updated and issued HEP handout. Patient will continue to benefit from skilled therapy services to reduce remaining deficits and improve functional ability.    OBJECTIVE IMPAIRMENTS Abnormal gait, cardiopulmonary status limiting activity, decreased activity tolerance, decreased balance, decreased endurance, decreased knowledge of condition, decreased knowledge of use of DME, decreased mobility, difficulty walking, decreased ROM, decreased strength, hypomobility, increased fascial restrictions, impaired perceived functional ability, impaired flexibility, and pain.   ACTIVITY LIMITATIONS carrying, lifting, bending, standing, squatting, sleeping, stairs, transfers, bed mobility, continence, bathing, locomotion level, and caring for others  PARTICIPATION LIMITATIONS: meal prep, cleaning, laundry, driving, shopping, community activity, and yard work  PERSONAL FACTORS Fitness, Past/current experiences, and Time since onset of injury/illness/exacerbation are also affecting patient's functional outcome.   REHAB POTENTIAL: Good  CLINICAL DECISION MAKING: Evolving/moderate complexity  EVALUATION COMPLEXITY: Moderate   GOALS: Goals reviewed with patient? Yes  SHORT TERM GOALS: Target date: 01/08/2022   patient will be independent with initial HEP  Baseline: Goal status: INITIAL  2.  Patient will improve 5 x STS score from 17.35 sec to 15 sec to demonstrate improved functional mobility and increased lower extremity strength.  Baseline:  Goal status: INITIAL    LONG TERM GOALS: Target date: 01/29/2022   Patient will be independent in self management strategies to improve quality of life and functional outcomes.  Baseline:  Goal status: INITIAL  2.  Patient will improve 5 x STS score from 17.23 sec to 13 sec to demonstrate improved functional mobility and increased  lower extremity strength.  Baseline:  Goal status: INITIAL  3.  Patient will improve FOTO score to predicted value to demonstrate improved functional mobility  Baseline: 52 Goal status: INITIAL  4.  Patient will report at least 50% improvement in overall symptoms and/or function to demonstrate improved functional mobility  Baseline:  Goal status: INITIAL  5.   Patient will increase bilateral lower extremity MMTs to 4+-5/5 to promote return to ambulation community distances with minimal deviation.  Baseline: see above Goal status: INITIAL  PLAN: PT FREQUENCY: 1x/week  PT DURATION: 6 weeks  PLANNED INTERVENTIONS: Therapeutic exercises, Therapeutic activity, Neuromuscular re-education, Balance training, Gait training, Patient/Family education, Joint manipulation, Joint mobilization, Stair training, Orthotic/Fit training, DME instructions, Aquatic Therapy, Dry Needling, Electrical stimulation, Spinal manipulation, Spinal  mobilization, Cryotherapy, Moist heat, Compression bandaging, scar mobilization, Splintting, Taping, Traction, Ultrasound, Ionotophoresis 4mg /ml Dexamethasone, and Manual therapy  PLAN FOR NEXT SESSION: Progress hip and core strengthening as tolerated.   11:22 AM, 12/25/21 Josue Hector PT DPT  Physical Therapist with [facility]  [phone]

## 2022-01-07 ENCOUNTER — Ambulatory Visit (HOSPITAL_COMMUNITY): Payer: Medicare HMO | Attending: Family Medicine

## 2022-01-07 ENCOUNTER — Encounter (HOSPITAL_COMMUNITY): Payer: Self-pay

## 2022-01-07 DIAGNOSIS — R2689 Other abnormalities of gait and mobility: Secondary | ICD-10-CM | POA: Diagnosis not present

## 2022-01-07 DIAGNOSIS — M25552 Pain in left hip: Secondary | ICD-10-CM | POA: Diagnosis not present

## 2022-01-07 DIAGNOSIS — R262 Difficulty in walking, not elsewhere classified: Secondary | ICD-10-CM | POA: Insufficient documentation

## 2022-01-07 NOTE — Therapy (Signed)
OUTPATIENT PHYSICAL THERAPY LOWER EXTREMITY TREATMENT   Patient Name: Gina Salazar MRN: [id number] DOB:07/09/1940, 81 y.o., female Today's Date: 01/07/2022   PT End of Session - 01/07/22 1247     Visit Number 3    Number of Visits 6    Date for PT Re-Evaluation 01/29/22    Authorization Type Aetna Medicare HMO, no VL; no auth    Progress Note Due on Visit 10    PT Start Time 0834    PT Stop Time 0920    PT Time Calculation (min) 46 min    Activity Tolerance Patient tolerated treatment well    Behavior During Therapy WFL for tasks assessed/performed              Past Medical History:  Diagnosis Date   Depression    High cholesterol    Hypertension    Past Surgical History:  Procedure Laterality Date   CHOLECYSTECTOMY     TONSILLECTOMY     There are no problems to display for this patient.   PCP:  Gina Andrea, MD     REFERRING PROVIDER: PT eval/tx for hx of falling, to improve gait mobility and strength, lt hip pain per Gina Abbott, NP   REFERRING DIAG: PT eval/tx for hx of falling, to improve gait mobility and strength, lt hip pain per Gina Abbott, NP   THERAPY DIAG:  Difficulty in walking, not elsewhere classified  Other abnormalities of gait and mobility  Pain in left hip  Rationale for Evaluation and Treatment Rehabilitation  ONSET DATE: years  SUBJECTIVE:   SUBJECTIVE STATEMENT: Pt stated she has completed her HEP about every other day.  Most difficulty with balance activities, no report of recent falls.  Does report some intermittent dull ache in lower back.  Does require some rest breaks during functional activities like cooking.  No reports of hip pain today.  PERTINENT HISTORY: R THA 4 years ago Has some hand and shoulder pain  PAIN:  Are you having pain? Yes: NPRS scale: 3/10 Pain location: left hip Pain description: achey Aggravating factors: standing, walking prolonged period of time Relieving factors:  Advil  PRECAUTIONS: None  WEIGHT BEARING RESTRICTIONS No  FALLS:  Has patient fallen in last 6 months? No  LIVING ENVIRONMENT: Lives with: lives with their spouse Lives in: House/apartment Stairs: Yes: External: 2 steps; none Has following equipment at home: Single point cane, Environmental consultant - 4 wheeled, Electronics engineer, and Grab bars  OCCUPATION: retired  PLOF: Independent  PATIENT GOALS better balance   OBJECTIVE:   DIAGNOSTIC FINDINGS:   EXAM: DUAL X-RAY ABSORPTIOMETRY (DXA) FOR BONE MINERAL DENSITY   IMPRESSION: Your patient Gina Salazar completed a BMD test on 11/25/2021 using the [employer] (software version: 14.10) manufactured by [employer]. The following summarizes the results of our evaluation. Technologist: Gina Salazar PATIENT BIOGRAPHICAL: Name: Gina Salazar Patient ID: [id number] Birth Date: 02-20-1941 Height: 63.0 in. Gender: Female Exam Date: 11/25/2021 Weight: 216.0 lbs. Indications: Caucasian, Follow up Osteoporosis, Low Calcium Intake, Post Menopausal Fractures: Treatments: Asprin, Fosamax, Vitamin D DENSITOMETRY RESULTS: Site         Region     Measured Date Measured Age WHO Classification Young Adult T-score BMD         %Change vs. Previous Significant Change (*) Left Femur Neck 11/25/2021 78.9 Osteopenia -1.2 0.870 g/cm2 8.6% Yes Left Femur Neck 09/15/2019 76.7 Osteopenia -1.7 0.801 g/cm2 - -   Left Femur Total 11/25/2021 78.9 Osteopenia -1.2 0.856 g/cm2 9.2% Yes  Left Femur Total 09/15/2019 76.7 Osteopenia -1.8 0.784 g/cm2 - -   Left Forearm Radius 33% 11/25/2021 78.9 Osteoporosis -3.0 0.497 g/cm2 4.6% - Left Forearm Radius 33% 09/15/2019 76.7 Osteoporosis -3.3 0.475 g/cm2 - - ASSESSMENT: The BMD measured at Forearm Radius 33% is 0.497 g/cm2 with a T-score of -3.0. This patient is considered osteoporotic according to World Health Organization [facility]) criteria. The scan quality is good. Compared with the prior study on  09/15/19, the BMD of the lt. total hip shows a statistically significant increase. Lumbar spine was excluded due to advanced degenerative changes. Rt. hip excluded due to surgical repair.   World Science writer [facility]) criteria for post-menopausal, Caucasian Women: Normal:       T-score at or above -1 SD Osteopenia:   T-score between -1 and -2.5 SD Osteoporosis: T-score at or below -2.5 SD   RECOMMENDATIONS: 1. All patients should optimize calcium and vitamin D intake. 2. Consider FDA-approved medical therapies in postmenopausal women and med aged 22 years and older, based on the following: a. A hip or vertebral (clinical or morphometric) fracture b. T-score< -2.5 at the femoral neck or spine after appropriate evaluation to exclude secondary causes c. Low bone mass (T-score between -1.0 and -2.5 at the femoral neck or spine) and a 10-year probability of a hip fracture > 3% or a 10-year probability of a major osteoporosis-related fracture > 20% based on the [place]-adapted WHO algorithm d. Clinician judgment and/or patient preferences may indicate treatment for people with 10-year fracture probabilities above or below these levels   FOLLOW-UP: Patients with diagnosis of osteoporosis or at high risk for fracture should have regular bone mineral density tests. For patients eligible for Medicare, routine testing is allowed once every 2 years. The testing frequency can be increased to one year for patients who have rapidly progressing disease, those who are receiving or discontinuing medical therapy to restore bone mass, or have additional risk factors.   I have reviewed this report, and agree with the above findings.   [facility]     PATIENT SURVEYS:  FOTO 52  COGNITION:  Overall cognitive status: Within functional limits for tasks assessed     SENSATION: Sometimes hands go to sleep at night   LOWER EXTREMITY MMT:  MMT Right eval Left eval  Hip flexion  4 4  Hip extension 4- 4-  Hip abduction 4- 4  Hip adduction    Hip internal rotation    Hip external rotation    Knee flexion 4+ 4  Knee extension 5 5  Ankle dorsiflexion 5 5  Ankle plantarflexion    Ankle inversion    Ankle eversion     (Blank rows = not tested)    FUNCTIONAL TESTS:  5 times sit to stand: 17.35 Timed up and go (TUG): not tested 2 minute walk test: 365 ft  GAIT: Distance walked: 365 ft Assistive device utilized: Single point cane Level of assistance: Modified independence Comments: R ankle instabilty    TODAY'S TREATMENT: 01/07/22  Heel raise on incline slope 10 Toe raise on decline slope Controlled STS no UE 10x Sidestep with GTB around thigh 2RT Tandem stance 3x 30" intermittent HHA required SLS max 3-4" prior need for HHA 5x Squat 10x Educated proper hand use with SPC for Rt knee/hip support 3 point sequence x54ft- begin walking program  Sidelying: abd 10x 5"  Supine: Bridge GTB around thigh 10x 5"   12/25/21 Bridge x 10  Clamshell 2 x 10 each SLR x  10 each  Heel raise x 10 Sit to stand no UE 1 x 10 Tandem stance 20 sec each Single leg stance 10 sec each    Physical therapy evaluation and HEP instruction   PATIENT EDUCATION:  Education details: Patient educated on exam findings, POC, scope of PT, HEP. Person educated: Patient Education method: Explanation, Demonstration, and Handouts Education comprehension: verbalized understanding, returned demonstration, verbal cues required, and tactile cues required   HOME EXERCISE PROGRAM: Access Code: [email or web address]  - Active Straight Leg Raise with Quad Set  - 2 x daily - 7 x weekly - 2 sets - 10 reps - Clamshell  - 2 x daily - 7 x weekly - 2 sets - 10 reps - Heel Raises with Counter Support  - 2 x daily - 7 x weekly - 2 sets - 10 reps - Sit to Stand Without Arm Support  - 2 x daily - 7 x weekly - 1-2 sets - 10 reps  URL: [email or web address] Date: 12/18/2021 Prepared by:  [facility] - Rehab  Exercises - Supine Bridge  - 2 x daily - 7 x weekly - 1 sets - 10 reps - Hooklying Clamshell with Resistance  - 2 x daily - 7 x weekly - 1 sets - 10 reps - Single Leg Stance with Support  - 2 x daily - 7 x weekly - 1 sets - 10 reps - 5-10 sec hold - Standing Tandem Balance with Counter Support  - 2 x daily - 7 x weekly - 1 sets - 10 reps - 5-10 sec hold  ASSESSMENT:  CLINICAL IMPRESSION: Session focus with hip strengthening and balance activities.  Pt arrived with [facility] and reports of occasional Rt knee buckling due to meniscus tear.  Educated on proper sequence and arm use with SPC, min cueing initially then able to demonstrate good mechanics with AD.  Added abduction exercises for gluteal strengthening to HEP and squats for functional strengthening. Pt educated on walking program in safe terrain to address activity tolerance.  No reports of increased pain through session.   OBJECTIVE IMPAIRMENTS Abnormal gait, cardiopulmonary status limiting activity, decreased activity tolerance, decreased balance, decreased endurance, decreased knowledge of condition, decreased knowledge of use of DME, decreased mobility, difficulty walking, decreased ROM, decreased strength, hypomobility, increased fascial restrictions, impaired perceived functional ability, impaired flexibility, and pain.   ACTIVITY LIMITATIONS carrying, lifting, bending, standing, squatting, sleeping, stairs, transfers, bed mobility, continence, bathing, locomotion level, and caring for others  PARTICIPATION LIMITATIONS: meal prep, cleaning, laundry, driving, shopping, community activity, and yard work  PERSONAL FACTORS Fitness, Past/current experiences, and Time since onset of injury/illness/exacerbation are also affecting patient's functional outcome.   REHAB POTENTIAL: Good  CLINICAL DECISION MAKING: Evolving/moderate complexity  EVALUATION COMPLEXITY: Moderate   GOALS: Goals reviewed with patient? Yes  SHORT TERM  GOALS: Target date: 01/08/2022   patient will be independent with initial HEP  Baseline: Goal status: IN PROGRESS  2.  Patient will improve 5 x STS score from 17.35 sec to 15 sec to demonstrate improved functional mobility and increased lower extremity strength.  Baseline:  Goal status: IN PROGRESS    LONG TERM GOALS: Target date: 01/29/2022   Patient will be independent in self management strategies to improve quality of life and functional outcomes.  Baseline:  Goal status: IN PROGRESS  2.  Patient will improve 5 x STS score from 17.23 sec to 13 sec to demonstrate improved functional mobility and increased lower extremity strength.  Baseline:  Goal status: IN PROGRESS  3.  Patient will improve FOTO score to predicted value to demonstrate improved functional mobility  Baseline: 52 Goal status: IN PROGRESS  4.  Patient will report at least 50% improvement in overall symptoms and/or function to demonstrate improved functional mobility  Baseline:  Goal status: IN PROGRESS  5.   Patient will increase bilateral lower extremity MMTs to 4+-5/5 to promote return to ambulation community distances with minimal deviation.  Baseline: see above Goal status: IN PROGRESS  PLAN: PT FREQUENCY: 1x/week  PT DURATION: 6 weeks  PLANNED INTERVENTIONS: Therapeutic exercises, Therapeutic activity, Neuromuscular re-education, Balance training, Gait training, Patient/Family education, Joint manipulation, Joint mobilization, Stair training, Orthotic/Fit training, DME instructions, Aquatic Therapy, Dry Needling, Electrical stimulation, Spinal manipulation, Spinal mobilization, Cryotherapy, Moist heat, Compression bandaging, scar mobilization, Splintting, Taping, Traction, Ultrasound, Ionotophoresis 4mg /ml Dexamethasone, and Manual therapy  PLAN FOR NEXT SESSION: Progress hip and core strengthening as tolerated.   , LPTA/CLT; CBIS [phone] 12:48 PM, 01/07/22

## 2022-01-15 ENCOUNTER — Ambulatory Visit (HOSPITAL_COMMUNITY): Payer: Medicare HMO | Admitting: Physical Therapy

## 2022-01-15 ENCOUNTER — Encounter (HOSPITAL_COMMUNITY): Payer: Self-pay | Admitting: Physical Therapy

## 2022-01-15 DIAGNOSIS — R2689 Other abnormalities of gait and mobility: Secondary | ICD-10-CM

## 2022-01-15 DIAGNOSIS — M25552 Pain in left hip: Secondary | ICD-10-CM

## 2022-01-15 DIAGNOSIS — R262 Difficulty in walking, not elsewhere classified: Secondary | ICD-10-CM

## 2022-01-15 NOTE — Therapy (Signed)
OUTPATIENT PHYSICAL THERAPY LOWER EXTREMITY TREATMENT   Patient Name: Gina Salazar MRN: [id number] DOB:Jul 30, 1940, 81 y.o., female Today's Date: 01/15/2022   PT End of Session - 01/15/22 1032     Visit Number 4    Number of Visits 6    Date for PT Re-Evaluation 01/29/22    Authorization Type [employer] Medicare HMO, no VL; no auth    Progress Note Due on Visit 10    PT Start Time 1031    PT Stop Time 1110    PT Time Calculation (min) 39 min    Activity Tolerance Patient tolerated treatment well    Behavior During Therapy WFL for tasks assessed/performed              Past Medical History:  Diagnosis Date   Depression    High cholesterol    Hypertension    Past Surgical History:  Procedure Laterality Date   CHOLECYSTECTOMY     TONSILLECTOMY     There are no problems to display for this patient.   PCP:  Leslie Andrea, MD     REFERRING PROVIDER: PT eval/tx for hx of falling, to improve gait mobility and strength, lt hip pain per Carlis Abbott, NP   REFERRING DIAG: PT eval/tx for hx of falling, to improve gait mobility and strength, lt hip pain per Carlis Abbott, NP   THERAPY DIAG:  Difficulty in walking, not elsewhere classified  Other abnormalities of gait and mobility  Pain in left hip  Rationale for Evaluation and Treatment Rehabilitation  ONSET DATE: years  SUBJECTIVE:   SUBJECTIVE STATEMENT: Patient states she is doing well today so far. No pain currently but hasn't done much yet today.  PERTINENT HISTORY: R THA 4 years ago Has some hand and shoulder pain  PAIN:  Are you having pain? Yes: NPRS scale: 0/10 Pain location: left hip Pain description: achey Aggravating factors: standing, walking prolonged period of time Relieving factors: Advil  PRECAUTIONS: None  WEIGHT BEARING RESTRICTIONS No  FALLS:  Has patient fallen in last 6 months? No  LIVING ENVIRONMENT: Lives with: lives with their spouse Lives in:  House/apartment Stairs: Yes: External: 2 steps; none Has following equipment at home: Single point cane, Environmental consultant - 4 wheeled, Electronics engineer, and Grab bars  OCCUPATION: retired  PLOF: Independent  PATIENT GOALS better balance   OBJECTIVE:   DIAGNOSTIC FINDINGS:   EXAM: DUAL X-RAY ABSORPTIOMETRY (DXA) FOR BONE MINERAL DENSITY   IMPRESSION: Your patient Gina Salazar completed a BMD test on 11/25/2021 using the [place] (software version: 14.10) manufactured by [employer]. The following summarizes the results of our evaluation. Technologist: [facility] PATIENT BIOGRAPHICAL: Name: Gina, Salazar Patient ID: [id number] Birth Date: [birth year] Height: 63.0 in. Gender: Female Exam Date: 11/25/2021 Weight: 216.0 lbs. Indications: Caucasian, Follow up Osteoporosis, Low Calcium Intake, Post Menopausal Fractures: Treatments: Asprin, Fosamax, Vitamin D DENSITOMETRY RESULTS: Site         Region     Measured Date Measured Age WHO Classification Young Adult T-score BMD         %Change vs. Previous Significant Change (*) Left Femur Neck 11/25/2023 78.9 Oste[age] -1.2 0.870 g/cm2 8.6% Yes Left Femur Neck 09/15/2019 76.7 Osteopenia -1.7 0.801 g/cm2 - -   Left Femur Total 11/25/2021 78.9 Osteopenia -1.2 0.856 g/cm2 9.2% Yes Left Femur Total 09/15/2019 76.7 Osteopenia -1.8 0.784 g/cm2 - -   Left Forearm Radius 33% 11/25/2021 78.9 Osteoporosis -3.0 0.497 g/cm2 4.6% - Left Forearm Radius 33% 09/15/2019 76.7 Osteoporosis -  3.3 0.475 g/cm2 - - ASSESSMENT: The BMD measured at Forearm Radius 33% is 0.497 g/cm2 with a T-score of -3.0. This patient is considered osteoporotic according to World Health Organization [facility]) criteria. The scan quality is good. Compared with the prior study on 09/15/19, the BMD of the lt. total hip shows a statistically significant increase. Lumbar spine was excluded due to advanced degenerative changes. Rt. hip excluded due to surgical  repair.   World Science writer [facility]) criteria for post-menopausal, Caucasian Women: Normal:       T-score at or above -1 SD Osteopenia:   T-score between -1 and -2.5 SD Osteoporosis: T-score at or below -2.5 SD   RECOMMENDATIONS: 1. All patients should optimize calcium and vitamin D intake. 2. Consider FDA-approved medical therapies in postmenopausal women and med aged 43 years and older, based on the following: a. A hip or vertebral (clinical or morphometric) fracture b. T-score< -2.5 at the femoral neck or spine after appropriate evaluation to exclude secondary causes c. Low bone mass (T-score between -1.0 and -2.5 at the femoral neck or spine) and a 10-year probability of a hip fracture > 3% or a 10-year probability of a major osteoporosis-related fracture > 20% based on the [place]-adapted WHO algorithm d. Clinician judgment and/or patient preferences may indicate treatment for people with 10-year fracture probabilities above or below these levels   FOLLOW-UP: Patients with diagnosis of osteoporosis or at high risk for fracture should have regular bone mineral density tests. For patients eligible for Medicare, routine testing is allowed once every 2 years. The testing frequency can be increased to one year for patients who have rapidly progressing disease, those who are receiving or discontinuing medical therapy to restore bone mass, or have additional risk factors.   I have reviewed this report, and agree with the above findings.   [facility]     PATIENT SURVEYS:  FOTO 52  COGNITION:  Overall cognitive status: Within functional limits for tasks assessed     SENSATION: Sometimes hands go to sleep at night   LOWER EXTREMITY MMT:  MMT Right eval Left eval  Hip flexion 4 4  Hip extension 4- 4-  Hip abduction 4- 4  Hip adduction    Hip internal rotation    Hip external rotation    Knee flexion 4+ 4  Knee extension 5 5  Ankle dorsiflexion 5 5   Ankle plantarflexion    Ankle inversion    Ankle eversion     (Blank rows = not tested)    FUNCTIONAL TESTS:  5 times sit to stand: 17.35 Timed up and go (TUG): not tested 2 minute walk test: 365 ft  GAIT: Distance walked: 365 ft Assistive device utilized: Single point cane Level of assistance: Modified independence Comments: R ankle instabilty    TODAY'S TREATMENT: 01/15/22 Heel raise x20 Toe raise x20 Mini squat 2 x 10  Standing hip abduction BTB 2 x 10 each Standing hip extension BTB 2 x 10 each Step up 4 inch HHA x 1  TKE BTB 20 x 5"   Octane bike 4 min EOS for mobility   01/07/22  Heel raise on incline slope 10 Toe raise on decline slope Controlled STS no UE 10x Sidestep with GTB around thigh 2RT Tandem stance 3x 30" intermittent HHA required SLS max 3-4" prior need for HHA 5x Squat 10x Educated proper hand use with SPC for Rt knee/hip support 3 point sequence x71ft- begin walking program  Sidelying: abd 10x 5"  Supine: Bridge GTB around thigh 10x 5"   12/25/21 Bridge x 10  Clamshell 2 x 10 each SLR x 10 each  Heel raise x 10 Sit to stand no UE 1 x 10 Tandem stance 20 sec each Single leg stance 10 sec each    Physical therapy evaluation and HEP instruction   PATIENT EDUCATION:  Education details: Patient educated on exam findings, POC, scope of PT, HEP. Person educated: Patient Education method: Explanation, Demonstration, and Handouts Education comprehension: verbalized understanding, returned demonstration, verbal cues required, and tactile cues required   HOME EXERCISE PROGRAM: Access Code: [email or web address]  01/15/22 - Hip Abduction with Resistance Loop  - 1-2 x daily - 7 x weekly - 2 sets - 10 reps - Hip Extension with Resistance Loop  - 1-2 x daily - 7 x weekly - 2 sets - 10 reps - Standing Terminal Knee Extension with Resistance  - 1-2 x daily - 7 x weekly - 2 sets - 10 reps   - Active Straight Leg Raise with Quad Set  - 2 x daily - 7 x  weekly - 2 sets - 10 reps - Clamshell  - 2 x daily - 7 x weekly - 2 sets - 10 reps - Heel Raises with Counter Support  - 2 x daily - 7 x weekly - 2 sets - 10 reps - Sit to Stand Without Arm Support  - 2 x daily - 7 x weekly - 1-2 sets - 10 reps  URL: [email or web address] Date: 12/18/2021 Prepared by: [facility] - Rehab  Exercises - Supine Bridge  - 2 x daily - 7 x weekly - 1 sets - 10 reps - Hooklying Clamshell with Resistance  - 2 x daily - 7 x weekly - 1 sets - 10 reps - Single Leg Stance with Support  - 2 x daily - 7 x weekly - 1 sets - 10 reps - 5-10 sec hold - Standing Tandem Balance with Counter Support  - 2 x daily - 7 x weekly - 1 sets - 10 reps - 5-10 sec hold  ASSESSMENT:  CLINICAL IMPRESSION: Patient with ongoing weakness in RT quad. Added 4 inch box steps and TKE for quad activation. Increased band resistance to blue for hip abduction and extension. Patient educated on purpose and function of all added exercises. Issued updated HEP and blue band for home use. Patient will continue to benefit from skilled therapy services to reduce remaining deficits and improve functional ability.    OBJECTIVE IMPAIRMENTS Abnormal gait, cardiopulmonary status limiting activity, decreased activity tolerance, decreased balance, decreased endurance, decreased knowledge of condition, decreased knowledge of use of DME, decreased mobility, difficulty walking, decreased ROM, decreased strength, hypomobility, increased fascial restrictions, impaired perceived functional ability, impaired flexibility, and pain.   ACTIVITY LIMITATIONS carrying, lifting, bending, standing, squatting, sleeping, stairs, transfers, bed mobility, continence, bathing, locomotion level, and caring for others  PARTICIPATION LIMITATIONS: meal prep, cleaning, laundry, driving, shopping, community activity, and yard work  PERSONAL FACTORS Fitness, Past/current experiences, and Time since onset of injury/illness/exacerbation  are also affecting patient's functional outcome.   REHAB POTENTIAL: Good  CLINICAL DECISION MAKING: Evolving/moderate complexity  EVALUATION COMPLEXITY: Moderate   GOALS: Goals reviewed with patient? Yes  SHORT TERM GOALS: Target date: 01/08/2022   patient will be independent with initial HEP  Baseline: Goal status: IN PROGRESS  2.  Patient will improve 5 x STS score from 17.35 sec to 15 sec to demonstrate improved functional mobility and increased lower extremity strength.  Baseline:  Goal status: IN PROGRESS    LONG TERM GOALS: Target date: 01/29/2022   Patient will be independent in self management strategies to improve quality of life and functional outcomes.  Baseline:  Goal status: IN PROGRESS  2.  Patient will improve 5 x STS score from 17.23 sec to 13 sec to demonstrate improved functional mobility and increased lower extremity strength.  Baseline:  Goal status: IN PROGRESS  3.  Patient will improve FOTO score to predicted value to demonstrate improved functional mobility  Baseline: 52 Goal status: IN PROGRESS  4.  Patient will report at least 50% improvement in overall symptoms and/or function to demonstrate improved functional mobility  Baseline:  Goal status: IN PROGRESS  5.   Patient will increase bilateral lower extremity MMTs to 4+-5/5 to promote return to ambulation community distances with minimal deviation.  Baseline: see above Goal status: IN PROGRESS  PLAN: PT FREQUENCY: 1x/week  PT DURATION: 6 weeks  PLANNED INTERVENTIONS: Therapeutic exercises, Therapeutic activity, Neuromuscular re-education, Balance training, Gait training, Patient/Family education, Joint manipulation, Joint mobilization, Stair training, Orthotic/Fit training, DME instructions, Aquatic Therapy, Dry Needling, Electrical stimulation, Spinal manipulation, Spinal mobilization, Cryotherapy, Moist heat, Compression bandaging, scar mobilization, Splintting, Taping, Traction,  Ultrasound, Ionotophoresis 4mg /ml Dexamethasone, and Manual therapy  PLAN FOR NEXT SESSION: Progress hip and core strengthening as tolerated.   10:33 AM, 01/15/22 01/17/22 PT DPT  Physical Therapist with [facility]  [phone]

## 2022-01-21 ENCOUNTER — Encounter (HOSPITAL_COMMUNITY): Payer: Medicare HMO

## 2022-01-31 ENCOUNTER — Ambulatory Visit (HOSPITAL_COMMUNITY): Payer: Medicare HMO | Attending: Family Medicine

## 2022-01-31 DIAGNOSIS — R262 Difficulty in walking, not elsewhere classified: Secondary | ICD-10-CM | POA: Insufficient documentation

## 2022-01-31 DIAGNOSIS — M25552 Pain in left hip: Secondary | ICD-10-CM | POA: Insufficient documentation

## 2022-01-31 DIAGNOSIS — R2689 Other abnormalities of gait and mobility: Secondary | ICD-10-CM | POA: Diagnosis not present

## 2022-01-31 NOTE — Therapy (Signed)
OUTPATIENT PHYSICAL THERAPY LOWER EXTREMITY PROGRESS NOTE Progress Note Reporting Period 12/18/2021 to 01/31/2022  See note below for Objective Data and Assessment of Progress/Goals.       Patient Name: Antoinett Dorman MRN: [id number] DOB:17-Sep-1940, 81 y.o., female Today's Date: 01/31/2022   PT End of Session - 01/31/22 0820     Visit Number 5    Number of Visits 6    Date for PT Re-Evaluation 01/29/22    Authorization Type [employer]na Medicare HMO, no VL; no auth    Progress Note Due on Visit 10    PT Start Time 0820    PT Stop Time 0900    PT Time Calculation (min) 40 min    Activity Tolerance Patient tolerated treatment well    Behavior During Therapy WFL for tasks assessed/performed               Past Medical History:  Diagnosis Date   Depression    High cholesterol    Hypertension    Past Surgical History:  Procedure Laterality Date   CHOLECYSTECTOMY     TONSILLECTOMY     There are no problems to display for this patient.   PCP:  Leslie Andrea, MD     REFERRING PROVIDER: PT eval/tx for hx of falling, to improve gait mobility and strength, lt hip pain per Carlis Abbott, NP   REFERRING DIAG: PT eval/tx for hx of falling, to improve gait mobility and strength, lt hip pain per Carlis Abbott, NP   THERAPY DIAG:  Difficulty in walking, not elsewhere classified - Plan: PT plan of care cert/re-cert  Other abnormalities of gait and mobility - Plan: PT plan of care cert/re-cert  Pain in left hip - Plan: PT plan of care cert/re-cert  Rationale for Evaluation and Treatment Rehabilitation  ONSET DATE: years  SUBJECTIVE:   SUBJECTIVE STATEMENT: 15% TO 20% Better; walking some without the cane but still has trouble with uneven ground and inclines/declines  PERTINENT HISTORY: R THA 4 years ago Has some hand and shoulder pain  PAIN:  Are you having pain? Yes: NPRS scale: 0/10 Pain location: left hip Pain description: achey Aggravating factors:  standing, walking prolonged period of time Relieving factors: Advil  PRECAUTIONS: None  WEIGHT BEARING RESTRICTIONS No  FALLS:  Has patient fallen in last 6 months? No  LIVING ENVIRONMENT: Lives with: lives with their spouse Lives in: House/apartment Stairs: Yes: External: 2 steps; none Has following equipment at home: Single point cane, Environmental consultant - 4 wheeled, Electronics engineer, and Grab bars  OCCUPATION: retired  PLOF: Independent  PATIENT GOALS better balance   OBJECTIVE:   DIAGNOSTIC FINDINGS:   EXAM: DUAL X-RAY ABSORPTIOMETRY (DXA) FOR BONE MINERAL DENSITY   IMPRESSION: Your patient Paitlyn Miotke completed a BMD test on 11/25/2021 using the [place] (software version: 14.10) manufactured by [employer]. The following summarizes the results of our evaluation. Technologist: [facility] PATIENT BIOGRAPHICAL: Name: Monserath, Neff Patient ID: [id number] Birth Date: [birth year] Height: 63.0 in. Gender: Female Exam Date: 11/25/2021 Weight: 216.0 lbs. Indications: Caucasian, Follow up Osteoporosis, Low Calcium Intake, Post Menopausal Fractures: Treatments: Asprin, Fosamax, Vitamin D DENSITOMETRY RESULTS: Site         Region     Measured Date Measured Age WHO Classification Young Adult T-score BMD         %Change vs. Previous Significant Change (*) Left Femur Neck 11/25/2021 78.9 Osteopenia -1.2 0.870 g/cm2 8.6% Yes Left Femur Neck 09/15/2019 76.7 Osteopenia -1.7 0.801 g/cm2 - -  Left Femur Total 11/25/2021 78.9 Osteopenia -1.2 0.856 g/cm2 9.2% Yes Left Femur Total 09/15/2019 76.7 Osteopenia -1.8 0.784 g/cm2 - -   Left Forearm Radius 33% 11/25/2021 78.9 Osteoporosis -3.0 0.497 g/cm2 4.6% - Left Forearm Radius 33% 09/15/2019 76.7 Osteoporosis -3.3 0.475 g/cm2 - - ASSESSMENT: The BMD measured at Forearm Radius 33% is 0.497 g/cm2 with a T-score of -3.0. This patient is considered osteoporotic according to [facility])  criteria. The scan quality is good. Compared with the prior study on 09/15/19, the BMD of the lt. total hip shows a statistically significant increase. Lumbar spine was excluded due to advanced degenerative changes. Rt. hip excluded due to surgical repair.   World Pharmacologist [facility]) criteria for post-menopausal, Caucasian Women: Normal:       T-score at or above -1 SD Osteopenia:   T-score between -1 and -2.5 SD Osteoporosis: T-score at or below -2.5 SD   RECOMMENDATIONS: 1. All patients should optimize calcium and vitamin D intake. 2. Consider FDA-approved medical therapies in postmenopausal women and med aged 1 years and older, based on the following: a. A hip or vertebral (clinical or morphometric) fracture b. T-score< -2.5 at the femoral neck or spine after appropriate evaluation to exclude secondary causes c. Low bone mass (T-score between -1.0 and -2.5 at the femoral neck or spine) and a 10-year probability of a hip fracture > 3% or a 10-year probability of a major osteoporosis-related fracture > 20% based on the [place]-adapted WHO algorithm d. Clinician judgment and/or patient preferences may indicate treatment for people with 10-year fracture probabilities above or below these levels   FOLLOW-UP: Patients with diagnosis of osteoporosis or at high risk for fracture should have regular bone mineral density tests. For patients eligible for Medicare, routine testing is allowed once every 2 years. The testing frequency can be increased to one year for patients who have rapidly progressing disease, those who are receiving or discontinuing medical therapy to restore bone mass, or have additional risk factors.   I have reviewed this report, and agree with the above findings.   [facility]     PATIENT SURVEYS:  FOTO 52  COGNITION:  Overall cognitive status: Within functional limits for tasks assessed     SENSATION: Sometimes hands go to sleep at  night   LOWER EXTREMITY MMT:  MMT Right eval Left eval Right 01/31/22 Left  01/31/22  Hip flexion _0 Hip extension 4- 4- 4- 4-  Hip abduction 4- 4 4+ 5  Hip adduction      Hip internal rotation      Hip external rotation      Knee flexion 4+ 4 4+ 4+  Knee extension _1 Ankle dorsiflexion _2 Ankle plantarflexion      Ankle inversion      Ankle eversion       (Blank rows = not tested)    FUNCTIONAL TESTS:  5 times sit to stand: 17.35 Timed up and go (TUG): not tested 2 minute walk test: 365 ft  GAIT: Distance walked: 365 ft Assistive device utilized: Single point cane Level of assistance: Modified independence Comments: R ankle instabilty    TODAY'S TREATMENT: 01/31/22 Progress note FOTO 54 5 times sit to stand 7.87 2 MWT 400 ft no AD  01/15/22 Heel raise x20 Toe raise x20 Mini squat 2 x 10  Standing hip abduction BTB 2 x 10 each Standing hip extension BTB 2 x  10 each Step up 4 inch HHA x 1  TKE BTB 20 x 5"   Octane bike 4 min EOS for mobility   01/07/22  Heel raise on incline slope 10 Toe raise on decline slope Controlled STS no UE 10x Sidestep with GTB around thigh 2RT Tandem stance 3x 30" intermittent HHA required SLS max 3-4" prior need for HHA 5x Squat 10x Educated proper hand use with SPC for Rt knee/hip support 3 point sequence [id number]- begin walking program  Sidelying: abd 10x 5"  Supine: Bridge GTB around thigh 10x 5"   12/25/21 Bridge x 10  Clamshell 2 x 10 each SLR x 10 each  Heel raise x 10 Sit to stand no UE 1 x 10 Tandem stance 20 sec each Single leg stance 10 sec each    Physical therapy evaluation and HEP instruction   PATIENT EDUCATION:  Education details: Patient educated on exam findings, POC, scope of PT, HEP. Person educated: Patient Education method: Explanation, Demonstration, and Handouts Education comprehension: verbalized understanding, returned demonstration, verbal cues required, and tactile  cues required   HOME EXERCISE PROGRAM: Access Code: [email or web address]  01/15/22 - Hip Abduction with Resistance Loop  - 1-2 x daily - 7 x weekly - 2 sets - 10 reps - Hip Extension with Resistance Loop  - 1-2 x daily - 7 x weekly - 2 sets - 10 reps - Standing Terminal Knee Extension with Resistance  - 1-2 x daily - 7 x weekly - 2 sets - 10 reps   - Active Straight Leg Raise with Quad Set  - 2 x daily - 7 x weekly - 2 sets - 10 reps - Clamshell  - 2 x daily - 7 x weekly - 2 sets - 10 reps - Heel Raises with Counter Support  - 2 x daily - 7 x weekly - 2 sets - 10 reps - Sit to Stand Without Arm Support  - 2 x daily - 7 x weekly - 1-2 sets - 10 reps  URL: [email or web address] Date: 12/18/2021 Prepared by: [facility] - Rehab  Exercises - Supine Bridge  - 2 x daily - 7 x weekly - 1 sets - 10 reps - Hooklying Clamshell with Resistance  - 2 x daily - 7 x weekly - 1 sets - 10 reps - Single Leg Stance with Support  - 2 x daily - 7 x weekly - 1 sets - 10 reps - 5-10 sec hold - Standing Tandem Balance with Counter Support  - 2 x daily - 7 x weekly - 1 sets - 10 reps - 5-10 sec hold  ASSESSMENT:  CLINICAL IMPRESSION: Progress note today.  Patient met short term goals and 1 LTG; made good improvement with 2 MWT Patient will continue to benefit from skilled therapy services to reduce remaining deficits and improve functional ability and to achieve unmet and partially met goals.  PT recommends continued therapy 1 x a week for 4 weeks.    OBJECTIVE IMPAIRMENTS Abnormal gait, cardiopulmonary status limiting activity, decreased activity tolerance, decreased balance, decreased endurance, decreased knowledge of condition, decreased knowledge of use of DME, decreased mobility, difficulty walking, decreased ROM, decreased strength, hypomobility, increased fascial restrictions, impaired perceived functional ability, impaired flexibility, and pain.   ACTIVITY LIMITATIONS carrying, lifting, bending, standing,  squatting, sleeping, stairs, transfers, bed mobility, continence, bathing, locomotion level, and caring for others  PARTICIPATION LIMITATIONS: meal prep, cleaning, laundry, driving, shopping, community activity, and yard work  [place] Past/current experiences, and Time since  onset of injury/illness/exacerbation are also affecting patient's functional outcome.   REHAB POTENTIAL: Good  CLINICAL DECISION MAKING: Evolving/moderate complexity  EVALUATION COMPLEXITY: Moderate   GOALS: Goals reviewed with patient? Yes  SHORT TERM GOALS: Target date: 01/08/2022   patient will be independent with initial HEP  Baseline: Goal status: MET  2.  Patient will improve 5 x STS score from 17.35 sec to 15 sec to demonstrate improved functional mobility and increased lower extremity strength.  Baseline: 7.87 01/31/22 Goal status: MET    LONG TERM GOALS: Target date: 01/29/2022   Patient will be independent in self management strategies to improve quality of life and functional outcomes.  Baseline:  Goal status: IN PROGRESS  2.  Patient will improve 5 x STS score from 17.23 sec to 13 sec to demonstrate improved functional mobility and increased lower extremity strength.  Baseline: 7.87 01/31/22 Goal status: MET  3.  Patient will improve FOTO score to predicted value to demonstrate improved functional mobility  Baseline: 52; 01/31/22 54 (goal 55) Goal status: IN PROGRESS  4.  Patient will report at least 50% improvement in overall symptoms and/or function to demonstrate improved functional mobility  Baseline: 15% to 20% better 01/31/22 Goal status: IN PROGRESS  5.   Patient will increase bilateral lower extremity MMTs to 4+-5/5 to promote return to ambulation community distances with minimal deviation.  Baseline: see above Goal status: IN PROGRESS  PLAN: PT FREQUENCY: 1x/week  PT DURATION: 4 weeks  PLANNED INTERVENTIONS: Therapeutic exercises, Therapeutic activity,  Neuromuscular re-education, Balance training, Gait training, Patient/Family education, Joint manipulation, Joint mobilization, Stair training, Orthotic/Fit training, DME instructions, Aquatic Therapy, Dry Needling, Electrical stimulation, Spinal manipulation, Spinal mobilization, Cryotherapy, Moist heat, Compression bandaging, scar mobilization, Splintting, Taping, Traction, Ultrasound, Ionotophoresis 20m/ml Dexamethasone, and Manual therapy  PLAN FOR NEXT SESSION: Progress hip and core strengthening as tolerated. PT recommends continued therapy 1 x a week for 4 weeks. Hip vectors  8:56 AM, 01/31/22 Dominic Rhome Small Robb Sibal MPT [facility] [place] #[phone]

## 2022-02-06 ENCOUNTER — Ambulatory Visit (HOSPITAL_COMMUNITY): Payer: Medicare HMO

## 2022-02-06 ENCOUNTER — Encounter (HOSPITAL_COMMUNITY): Payer: Self-pay

## 2022-02-06 DIAGNOSIS — R262 Difficulty in walking, not elsewhere classified: Secondary | ICD-10-CM

## 2022-02-06 DIAGNOSIS — M25552 Pain in left hip: Secondary | ICD-10-CM | POA: Diagnosis not present

## 2022-02-06 DIAGNOSIS — R2689 Other abnormalities of gait and mobility: Secondary | ICD-10-CM

## 2022-02-06 NOTE — Therapy (Addendum)
OUTPATIENT PHYSICAL THERAPY LOWER EXTREMITY PROGRESS NOTE   Patient Name: Gina Salazar MRN: [id number] DOB:Dec 19, 1940, 81 y.o., female Today's Date: 02/06/2022   PT End of Session - 02/06/22 1516     Visit Number 6    Number of Visits 10    Date for PT Re-Evaluation 02/28/22    Authorization Type [employer] HMO, no VL; no auth    Progress Note Due on Visit 10    PT Start Time 1518    PT Stop Time 1558    PT Time Calculation (min) 40 min    Activity Tolerance Patient tolerated treatment well    Behavior During Therapy WFL for tasks assessed/performed                Past Medical History:  Diagnosis Date   Depression    High cholesterol    Hypertension    Past Surgical History:  Procedure Laterality Date   CHOLECYSTECTOMY     TONSILLECTOMY     There are no problems to display for this patient.   PCP:  Leslie Andrea, MD     REFERRING PROVIDER: PT eval/tx for hx of falling, to improve gait mobility and strength, lt hip pain per Carlis Abbott, NP   REFERRING DIAG: PT eval/tx for hx of falling, to improve gait mobility and strength, lt hip pain per Carlis Abbott, NP   THERAPY DIAG:  Difficulty in walking, not elsewhere classified  Other abnormalities of gait and mobility  Pain in left hip  Rationale for Evaluation and Treatment Rehabilitation  ONSET DATE: years  SUBJECTIVE:   SUBJECTIVE STATEMENT: Pt stated she is tired at entrance today, stated it was a long walk to get to rehab today.  No reports of pain currently, does have some pain when sitting for prolonged period of time.    PERTINENT HISTORY: R THA 4 years ago Has some hand and shoulder pain  PAIN:  Are you having pain? Yes: NPRS scale: 0/10 Pain location: left hip Pain description: achey Aggravating factors: standing, walking prolonged period of time Relieving factors: Advil  PRECAUTIONS: None  WEIGHT BEARING RESTRICTIONS No  FALLS:  Has patient fallen in last 6  months? No  LIVING ENVIRONMENT: Lives with: lives with their spouse Lives in: House/apartment Stairs: Yes: External: 2 steps; none Has following equipment at home: Single point cane, Environmental consultant - 4 wheeled, Electronics engineer, and Grab bars  OCCUPATION: retired  PLOF: Independent  PATIENT GOALS better balance   OBJECTIVE:   DIAGNOSTIC FINDINGS:   EXAM: DUAL X-RAY ABSORPTIOMETRY (DXA) FOR BONE MINERAL DENSITY   IMPRESSION: Your patient Gina Salazar completed a BMD test on 11/25/2021 using the [employer] (software version: 14.10) manufactured by [employer]. The following summarizes the results of our evaluation. Technologist: [facility] PATIENT BIOGRAPHICAL: Name: Gina Salazar, Gina Salazar Patient ID: [id number] Birth Date: 20-Dec-1940 Height: 63.0 in. Gender: Female Exam Date: 11/25/2021 Weight: 216.0 lbs. Indications: Caucasian, Follow up Osteoporosis, Low Calcium Intake, Post Menopausal Fractures: Treatments: Asprin, Fosamax, Vitamin D DENSITOMETRY RESULTS: Site         Region     Measured Date 79.9 Osteopenia -1.2 0.870 g/cm2 8.6% Yes BMD         %Change vs. Previous Significant Change (*) Left Femur Neck 11/25/2021 81 Osteopenia -1.2 0.870 g/cm2 8.6% Yes Left Femur Neck 09/15/2019 81 Osteopenia -1.7 0.801 g/cm2 - -   Left Femur Total 11/25/2021 81 Osteopenia -1.2 0.856 g/cm2 9.2% Yes Left Femur Total 09/15/2019 81 Osteopenia -1.8 0.784 g/cm2 - -  Left Forearm Radius 33% 11/25/2021 81 Osteoporosis -3.0 0.497 g/cm2 4.6% - Left Forearm Radius 33% 09/15/2019 81 Osteoporosis -3.3 0.475 g/cm2 - - ASSESSMENT: The BMD measured at Forearm Radius 33% is 0.497 g/cm2 with a T-score of -3.0. This patient is considered osteoporotic according to [facility]) criteria. The scan quality is good. Compared with the prior study on 09/15/19, the BMD of the lt. total hip shows a statistically significant increase. Lumbar spine  was excluded due to advanced degenerative changes. Rt. hip excluded due to surgical repair.   World Pharmacologist [facility]) criteria for post-menopausal, Caucasian Women: Normal:       T-score at or above -1 SD Osteopenia:   T-score between -1 and -2.5 SD Osteoporosis: T-score at or below -2.5 SD   RECOMMENDATIONS: 1. All patients should optimize calcium and vitamin D intake. 2. Consider FDA-approved medical therapies in postmenopausal women and med aged 79 years and older, based on the following: a. A hip or vertebral (clinical or morphometric) fracture b. T-score< -2.5 at the femoral neck or spine after appropriate evaluation to exclude secondary causes c. Low bone mass (T-score between -1.0 and -2.5 at the femoral neck or spine) and a 10-year probability of a hip fracture > 3% or a 10-year probability of a major osteoporosis-related fracture > 20% based on the [place]-adapted WHO algorithm d. Clinician judgment and/or patient preferences may indicate treatment for people with 10-year fracture probabilities above or below these levels   FOLLOW-UP: Patients with diagnosis of osteoporosis or at high risk for fracture should have regular bone mineral density tests. For patients eligible for Medicare, routine testing is allowed once every 2 years. The testing frequency can be increased to one year for patients who have rapidly progressing disease, those who are receiving or discontinuing medical therapy to restore bone mass, or have additional risk factors.   I have reviewed this report, and agree with the above findings.   [facility]     PATIENT SURVEYS:  FOTO 52  COGNITION:  Overall cognitive status: Within functional limits for tasks assessed     SENSATION: Sometimes hands go to sleep at night   LOWER EXTREMITY MMT:  MMT Right eval Left eval Right 01/31/22 Left  01/31/22  Hip flexion _0 Hip extension 4- 4- 4- 4-  Hip abduction 4- 4 4+ 5   Hip adduction      Hip internal rotation      Hip external rotation      Knee flexion 4+ 4 4+ 4+  Knee extension _1 Ankle dorsiflexion _2 Ankle plantarflexion      Ankle inversion      Ankle eversion       (Blank rows = not tested)    FUNCTIONAL TESTS:  5 times sit to stand: 17.35 Timed up and go (TUG): not tested 2 minute walk test: 365 ft  GAIT: Distance walked: 365 ft Assistive device utilized: Single point cane Level of assistance: Modified independence Comments: R ankle instabilty    TODAY'S TREATMENT: 02/06/22 Heel raise x20 Toe raise x20 Squats front of chair no HHA 2x 10 Vector stance 3x 5" BLE Sidestep with GTB around thigh 2RT in hallway (~12 ft) Tandem stance on foam 2x 30" with intermittent HHA required Marching 10x 5" alternating with intermittent HHA Step up 4in 10x each LE 1 HHA  01/31/22 Progress note FOTO 54 5 times sit to stand 7.87 2 MWT  400 ft no AD  01/15/22 Heel raise x20 Toe raise x20 Mini squat 2 x 10  Standing hip abduction BTB 2 x 10 each Standing hip extension BTB 2 x 10 each Step up 4 inch HHA x 1  TKE BTB 20 x 5"   Octane bike 4 min EOS for mobility   01/07/22  Heel raise on incline slope 10 Toe raise on decline slope Controlled STS no UE 10x Sidestep with GTB around thigh 2RT Tandem stance 3x 30" intermittent HHA required SLS max 3-4" prior need for HHA 5x Squat 10x Educated proper hand use with SPC for Rt knee/hip support 3 point sequence [id number]- begin walking program  Sidelying: abd 10x 5"  Supine: Bridge GTB around thigh 10x 5"   12/25/21 Bridge x 10  Clamshell 2 x 10 each SLR x 10 each  Heel raise x 10 Sit to stand no UE 1 x 10 Tandem stance 20 sec each Single leg stance 10 sec each    Physical therapy evaluation and HEP instruction   PATIENT EDUCATION:  Education details: Patient educated on exam findings, POC, scope of PT, HEP. Person educated: Patient Education method: Explanation,  Demonstration, and Handouts Education comprehension: verbalized understanding, returned demonstration, verbal cues required, and tactile cues required   HOME EXERCISE PROGRAM: Access Code: [email or web address]  01/15/22 - Hip Abduction with Resistance Loop  - 1-2 x daily - 7 x weekly - 2 sets - 10 reps - Hip Extension with Resistance Loop  - 1-2 x daily - 7 x weekly - 2 sets - 10 reps - Standing Terminal Knee Extension with Resistance  - 1-2 x daily - 7 x weekly - 2 sets - 10 reps   - Active Straight Leg Raise with Quad Set  - 2 x daily - 7 x weekly - 2 sets - 10 reps - Clamshell  - 2 x daily - 7 x weekly - 2 sets - 10 reps - Heel Raises with Counter Support  - 2 x daily - 7 x weekly - 2 sets - 10 reps - Sit to Stand Without Arm Support  - 2 x daily - 7 x weekly - 1-2 sets - 10 reps  URL: [email or web address] Date: 12/18/2021 Prepared by: [facility] - Rehab  Exercises - Supine Bridge  - 2 x daily - 7 x weekly - 1 sets - 10 reps - Hooklying Clamshell with Resistance  - 2 x daily - 7 x weekly - 1 sets - 10 reps - Single Leg Stance with Support  - 2 x daily - 7 x weekly - 1 sets - 10 reps - 5-10 sec hold - Standing Tandem Balance with Counter Support  - 2 x daily - 7 x weekly - 1 sets - 10 reps - 5-10 sec hold  ASSESSMENT:  CLINICAL IMPRESSION: Session focus with LE strengthening and additional balance activities.  Added vector stance for hip stability and progressed tandem stance with dynamic surface.  New exercises were difficult for patient, required intermittent HHA for LOB episodes.  Pt was limited by fatigue with activities, required short duration rest breaks.  Rt knee was fatigued with vector stance and increased difficulty with step up training compared to Lt, monitor pain through session.  Added sidestep with theraband resistance to HEP, encouraged to have HHA available for safety.     OBJECTIVE IMPAIRMENTS Abnormal gait, cardiopulmonary status limiting activity, decreased  activity tolerance, decreased balance, decreased endurance, decreased knowledge of condition, decreased knowledge of use of DME, decreased  mobility, difficulty walking, decreased ROM, decreased strength, hypomobility, increased fascial restrictions, impaired perceived functional ability, impaired flexibility, and pain.   ACTIVITY LIMITATIONS carrying, lifting, bending, standing, squatting, sleeping, stairs, transfers, bed mobility, continence, bathing, locomotion level, and caring for others  PARTICIPATION LIMITATIONS: meal prep, cleaning, laundry, driving, shopping, community activity, and yard work  Gisela, Past/current experiences, and Time since onset of injury/illness/exacerbation are also affecting patient's functional outcome.   REHAB POTENTIAL: Good  CLINICAL DECISION MAKING: Evolving/moderate complexity  EVALUATION COMPLEXITY: Moderate   GOALS: Goals reviewed with patient? Yes  SHORT TERM GOALS: Target date: 01/08/2022   patient will be independent with initial HEP  Baseline: Goal status: MET  2.  Patient will improve 5 x STS score from 17.35 sec to 15 sec to demonstrate improved functional mobility and increased lower extremity strength.  Baseline: 7.87 01/31/22 Goal status: MET    LONG TERM GOALS: Target date: 01/29/2022   Patient will be independent in self management strategies to improve quality of life and functional outcomes.  Baseline:  Goal status: IN PROGRESS  2.  Patient will improve 5 x STS score from 17.23 sec to 13 sec to demonstrate improved functional mobility and increased lower extremity strength.  Baseline: 7.87 01/31/22 Goal status: MET  3.  Patient will improve FOTO score to predicted value to demonstrate improved functional mobility  Baseline: 52; 01/31/22 54 (goal 55) Goal status: IN PROGRESS  4.  Patient will report at least 50% improvement in overall symptoms and/or function to demonstrate improved functional  mobility  Baseline: 15% to 20% better 01/31/22 Goal status: IN PROGRESS  5.   Patient will increase bilateral lower extremity MMTs to 4+-5/5 to promote return to ambulation community distances with minimal deviation.  Baseline: see above Goal status: IN PROGRESS  PLAN: PT FREQUENCY: 1x/week  PT DURATION: 4 weeks  PLANNED INTERVENTIONS: Therapeutic exercises, Therapeutic activity, Neuromuscular re-education, Balance training, Gait training, Patient/Family education, Joint manipulation, Joint mobilization, Stair training, Orthotic/Fit training, DME instructions, Aquatic Therapy, Dry Needling, Electrical stimulation, Spinal manipulation, Spinal mobilization, Cryotherapy, Moist heat, Compression bandaging, scar mobilization, Splintting, Taping, Traction, Ultrasound, Ionotophoresis 75m/ml Dexamethasone, and Manual therapy  PLAN FOR NEXT SESSION: Progress hip and core strengthening as tolerated. PT recommends continued therapy 1 x a week for 4 weeks. Progress balance.  Add paloff for core strengthening in NBOS and progress to partial tandem stance as able.  CIhor Austin LPTA/CLT; CBIS [phone] 4:02 PM, 02/06/22

## 2022-02-12 ENCOUNTER — Encounter (HOSPITAL_COMMUNITY): Payer: Medicare HMO

## 2022-02-18 ENCOUNTER — Ambulatory Visit (HOSPITAL_COMMUNITY): Payer: Medicare HMO

## 2022-02-18 ENCOUNTER — Encounter (HOSPITAL_COMMUNITY): Payer: Self-pay

## 2022-02-18 DIAGNOSIS — R262 Difficulty in walking, not elsewhere classified: Secondary | ICD-10-CM

## 2022-02-18 DIAGNOSIS — R2689 Other abnormalities of gait and mobility: Secondary | ICD-10-CM | POA: Diagnosis not present

## 2022-02-18 DIAGNOSIS — M25552 Pain in left hip: Secondary | ICD-10-CM

## 2022-02-18 NOTE — Therapy (Signed)
OUTPATIENT PHYSICAL THERAPY LOWER EXTREMITY TREATMENT   Patient Name: Kamora Vossler MRN: [id number] DOB:November 14, 1940, 81 y.o., female Today's Date: 02/18/2022   PT End of Session - 02/18/22 0830     Visit Number 7    Number of Visits 10    Date for PT Re-Evaluation 02/28/22    Authorization Type [employer] Medicare HMO, no VL; no auth    Progress Note Due on Visit 10    PT Start Time 0823    PT Stop Time 0905    PT Time Calculation (min) 42 min    Activity Tolerance Patient tolerated treatment well    Behavior During Therapy WFL for tasks assessed/performed                 Past Medical History:  Diagnosis Date   Depression    High cholesterol    Hypertension    Past Surgical History:  Procedure Laterality Date   CHOLECYSTECTOMY     TONSILLECTOMY     There are no problems to display for this patient.   PCP:  Leslie Andrea, MD     REFERRING PROVIDER: PT eval/tx for hx of falling, to improve gait mobility and strength, lt hip pain per Carlis Abbott, NP   REFERRING DIAG: PT eval/tx for hx of falling, to improve gait mobility and strength, lt hip pain per Carlis Abbott, NP   THERAPY DIAG:  Difficulty in walking, not elsewhere classified  Other abnormalities of gait and mobility  Pain in left hip  Rationale for Evaluation and Treatment Rehabilitation  ONSET DATE: years  SUBJECTIVE:   SUBJECTIVE STATEMENT: Pt stated she is tired at entrance today, stated it was a long walk to get to rehab today.  No reports of pain currently, does have some pain when sitting for prolonged period of time.    Delayed start as confused where to go this morning.  No reports of pain currently, was a little sore following last session.  Does have soreness following sitting for prolonged periods of time.    PERTINENT HISTORY: R THA 4 years ago Has some hand and shoulder pain  PAIN:  Are you having pain? Yes: NPRS scale: 0/10 Pain location: left hip Pain description:  achey Aggravating factors: standing, walking prolonged period of time Relieving factors: Advil  PRECAUTIONS: None  WEIGHT BEARING RESTRICTIONS No  FALLS:  Has patient fallen in last 6 months? No  LIVING ENVIRONMENT: Lives with: lives with their spouse Lives in: House/apartment Stairs: Yes: External: 2 steps; none Has following equipment at home: Single point cane, Environmental consultant - 4 wheeled, Electronics engineer, and Grab bars  OCCUPATION: retired  PLOF: Independent  PATIENT GOALS better balance   OBJECTIVE:   DIAGNOSTIC FINDINGS:   EXAM: DUAL X-RAY ABSORPTIOMETRY (DXA) FOR BONE MINERAL DENSITY   IMPRESSION: Your patient Jonita Arntz completed a BMD test on 11/25/2021 using the [place] (software version: 14.10) manufactured by [employer]. The following summarizes the results of our evaluation. Technologist: [facility] PATIENT BIOGRAPHICAL: Name: Darrian, Goodwill Patient ID: [id number] Birth Date: 1940/04/23 Height: 63.0 in. Gender: Female Exam Date: 11/25/2021 Weight: 216.0 lbs. Indications: Caucasian, Follow up Osteoporosis, Low Calcium Intake, Post Menopausal Fractures: Treatments: Asprin, Fosamax, Vitamin D DENSITOMETRY RESULTS: Site         Region     Measured Date Measured Age WHO Classification Young Adult T-score BMD         %Change vs. Previous Significant Change (*) Left Femur Neck 13/25/[age] 78.9 Oste[age] -1.2 0.870  g/cm2 8.6% Yes Left Femur Neck 09[age]15/2019 76.7 Osteopenia -1.7 0.801 g/cm2 - -   Left Femur Total 11/25/2023 78.9 Osteopenia -1.2 0.856 g/cm2 9.2% Yes Left Femur Total 09/15/2019 76.7 Osteopenia -1.8 0.784 g/cm2 - -   Left Forearm Radius 33% 11/25/2023 78.9 Osteoporosis -3.0 0.497 g/cm2 4.6% - Left Forearm Radius 33% 09/15/2019 76.7 Osteoporosis -3.3 0.475 g/cm2 - - ASSESSMENT: The BMD measured at Forearm Radius 33% is 0.497 g/cm2 with a T-score of -3.0. This patient is considered osteoporotic according to  [facility]) criteria. The scan quality is good. Compared with the prior study on 09/15/19, the BMD of the lt. total hip shows a statistically significant increase. Lumbar spine was excluded due to advanced degenerative changes. Rt. hip excluded due to surgical repair.   World Pharmacologist [facility]) criteria for post-menopausal, Caucasian Women: Normal:       T-score at or above -1 SD Osteopenia:   T-score between -1 and -2.5 SD Osteoporosis: T-score at or below -2.5 SD   RECOMMENDATIONS: 1. All patients should optimize calcium and vitamin D intake. 2. Consider FDA-approved medical therapies in postmenopausal women and med aged 70 years and older, based on the following: a. A hip or vertebral (clinical or morphometric) fracture b. T-score< -2.5 at the femoral neck or spine after appropriate evaluation to exclude secondary causes c. Low bone mass (T-score between -1.0 and -2.5 at the femoral neck or spine) and a 10-year probability of a hip fracture > 3% or a 10-year probability of a major osteoporosis-related fracture > 20% based on the [place]-adapted WHO algorithm d. Clinician judgment and/or patient preferences may indicate treatment for people with 10-year fracture probabilities above or below these levels   FOLLOW-UP: Patients with diagnosis of osteoporosis or at high risk for fracture should have regular bone mineral density tests. For patients eligible for Medicare, routine testing is allowed once every 2 years. The testing frequency can be increased to one year for patients who have rapidly progressing disease, those who are receiving or discontinuing medical therapy to restore bone mass, or have additional risk factors.   I have reviewed this report, and agree with the above findings.   [facility]     PATIENT SURVEYS:  FOTO 52  COGNITION:  Overall cognitive status: Within functional limits for tasks  assessed     SENSATION: Sometimes hands go to sleep at night   LOWER EXTREMITY MMT:  MMT Right eval Left eval Right 01/31/22 Left  01/31/22  Hip flexion _0 Hip extension 4- 4- 4- 4-  Hip abduction 4- 4 4+ 5  Hip adduction      Hip internal rotation      Hip external rotation      Knee flexion 4+ 4 4+ 4+  Knee extension _1 Ankle dorsiflexion _2 Ankle plantarflexion      Ankle inversion      Ankle eversion       (Blank rows = not tested)    FUNCTIONAL TESTS:  5 times sit to stand: 17.35 Timed up and go (TUG): not tested 2 minute walk test: 365 ft  GAIT: Distance walked: 365 ft Assistive device utilized: Single point cane Level of assistance: Modified independence Comments: R ankle instabilty    TODAY'S TREATMENT: 02/18/22 STS then heel raise 10x 3" Squat then heel raise 10x 3" Paloff partial tandem stance 4x 10 with GTB Vector stance 3x 5" Step up 4in 10x  each LE 1 HHA Step down 2in Rt LE Step down 4in Lt LE Tandem stance on foam 2x 30"    02/06/22 Heel raise x20 Toe raise x20 Squats front of chair no HHA 2x 10 Vector stance 3x 5" BLE Sidestep with GTB around thigh 2RT in hallway (~12 ft) Tandem stance on foam 2x 30" with intermittent HHA required Marching 10x 5" alternating with intermittent HHA Step up 4in 10x each LE 1 HHA  01/31/22 Progress note FOTO 54 5 times sit to stand 7.87 2 MWT 400 ft no AD  01/15/22 Heel raise x20 Toe raise x20 Mini squat 2 x 10  Standing hip abduction BTB 2 x 10 each Standing hip extension BTB 2 x 10 each Step up 4 inch HHA x 1  TKE BTB 20 x 5"   Octane bike 4 min EOS for mobility   01/07/22  Heel raise on incline slope 10 Toe raise on decline slope Controlled STS no UE 10x Sidestep with GTB around thigh 2RT Tandem stance 3x 30" intermittent HHA required SLS max 3-4" prior need for HHA 5x Squat 10x Educated proper hand use with SPC for Rt knee/hip support 3 point sequence x3f- begin  walking program  Sidelying: abd 10x 5"  Supine: Bridge GTB around thigh 10x 5"   12/25/21 Bridge x 10  Clamshell 2 x 10 each SLR x 10 each  Heel raise x 10 Sit to stand no UE 1 x 10 Tandem stance 20 sec each Single leg stance 10 sec each    Physical therapy evaluation and HEP instruction   PATIENT EDUCATION:  Education details: Patient educated on exam findings, POC, scope of PT, HEP. Person educated: Patient Education method: Explanation, Demonstration, and Handouts Education comprehension: verbalized understanding, returned demonstration, verbal cues required, and tactile cues required   HOME EXERCISE PROGRAM: Access Code: [email or web address] 02/18/22:  Sidestep with theraband Begin walking program 01/15/22 - Hip Abduction with Resistance Loop  - 1-2 x daily - 7 x weekly - 2 sets - 10 reps - Hip Extension with Resistance Loop  - 1-2 x daily - 7 x weekly - 2 sets - 10 reps - Standing Terminal Knee Extension with Resistance  - 1-2 x daily - 7 x weekly - 2 sets - 10 reps   - Active Straight Leg Raise with Quad Set  - 2 x daily - 7 x weekly - 2 sets - 10 reps - Clamshell  - 2 x daily - 7 x weekly - 2 sets - 10 reps - Heel Raises with Counter Support  - 2 x daily - 7 x weekly - 2 sets - 10 reps - Sit to Stand Without Arm Support  - 2 x daily - 7 x weekly - 1-2 sets - 10 reps  URL: [email or web address] Date: 12/18/2021 Prepared by: [facility] - Rehab  Exercises - Supine Bridge  - 2 x daily - 7 x weekly - 1 sets - 10 reps - Hooklying Clamshell with Resistance  - 2 x daily - 7 x weekly - 1 sets - 10 reps - Single Leg Stance with Support  - 2 x daily - 7 x weekly - 1 sets - 10 reps - 5-10 sec hold - Standing Tandem Balance with Counter Support  - 2 x daily - 7 x weekly - 1 sets - 10 reps - 5-10 sec hold  ASSESSMENT:  CLINICAL IMPRESSION: Continued session focus with LE strengthening and balance stability exercises.  Pt required intermittent HHA with balance activities  for  stability.  Educated importance of core strengthening to assist with standing functional activities.  Added pallof this session for core stability.  Pt with history of meniscus tear Rt knee, increased difficulty with step down training Rt knee compared to Lt, monitored through session.  Pt tolerated well to session.   OBJECTIVE IMPAIRMENTS Abnormal gait, cardiopulmonary status limiting activity, decreased activity tolerance, decreased balance, decreased endurance, decreased knowledge of condition, decreased knowledge of use of DME, decreased mobility, difficulty walking, decreased ROM, decreased strength, hypomobility, increased fascial restrictions, impaired perceived functional ability, impaired flexibility, and pain.   ACTIVITY LIMITATIONS carrying, lifting, bending, standing, squatting, sleeping, stairs, transfers, bed mobility, continence, bathing, locomotion level, and caring for others  PARTICIPATION LIMITATIONS: meal prep, cleaning, laundry, driving, shopping, community activity, and yard work  [place], Past/current experiences, and Time since onset of injury/illness/exacerbation are also affecting patient's functional outcome.   REHAB POTENTIAL: Good  CLINICAL DECISION MAKING: Evolving/moderate complexity  EVALUATION COMPLEXITY: Moderate   GOALS: Goals reviewed with patient? Yes  SHORT TERM GOALS: Target date: 01/08/2022   patient will be independent with initial HEP  Baseline: Goal status: MET  2.  Patient will improve 5 x STS score from 17.35 sec to 15 sec to demonstrate improved functional mobility and increased lower extremity strength.  Baseline: 7.87 01/31/22 Goal status: MET    LONG TERM GOALS: Target date: 01/29/2022   Patient will be independent in self management strategies to improve quality of life and functional outcomes.  Baseline:  Goal status: IN PROGRESS  2.  Patient will improve 5 x STS score from 17.23 sec to 13 sec to demonstrate  improved functional mobility and increased lower extremity strength.  Baseline: 7.87 01/31/22 Goal status: MET  3.  Patient will improve FOTO score to predicted value to demonstrate improved functional mobility  Baseline: 52; 01/31/22 54 (goal 55) Goal status: IN PROGRESS  4.  Patient will report at least 50% improvement in overall symptoms and/or function to demonstrate improved functional mobility  Baseline: 15% to 20% better 01/31/22 Goal status: IN PROGRESS  5.   Patient will increase bilateral lower extremity MMTs to 4+-5/5 to promote return to ambulation community distances with minimal deviation.  Baseline: see above Goal status: IN PROGRESS  PLAN: PT FREQUENCY: 1x/week  PT DURATION: 4 weeks  PLANNED INTERVENTIONS: Therapeutic exercises, Therapeutic activity, Neuromuscular re-education, Balance training, Gait training, Patient/Family education, Joint manipulation, Joint mobilization, Stair training, Orthotic/Fit training, DME instructions, Aquatic Therapy, Dry Needling, Electrical stimulation, Spinal manipulation, Spinal mobilization, Cryotherapy, Moist heat, Compression bandaging, scar mobilization, Splintting, Taping, Traction, Ultrasound, Ionotophoresis 81m/ml Dexamethasone, and Manual therapy  PLAN FOR NEXT SESSION: Progress hip and core strengthening as tolerated. PT recommends continued therapy 1 x a week for 4 weeks. Progress balance and functional activities.    CIhor Austin LPTA/CLT; CDelana [phone] 4:38 PM, 02/18/22

## 2022-02-26 ENCOUNTER — Ambulatory Visit (HOSPITAL_COMMUNITY): Payer: Medicare HMO

## 2022-02-26 DIAGNOSIS — R2689 Other abnormalities of gait and mobility: Secondary | ICD-10-CM | POA: Diagnosis not present

## 2022-02-26 DIAGNOSIS — R262 Difficulty in walking, not elsewhere classified: Secondary | ICD-10-CM

## 2022-02-26 DIAGNOSIS — M25552 Pain in left hip: Secondary | ICD-10-CM

## 2022-02-26 NOTE — Therapy (Signed)
OUTPATIENT PHYSICAL THERAPY LOWER EXTREMITY TREATMENT   Patient Name: Gina Salazar MRN: [id number] DOB:1940-06-18, 81 y.o., female Today's Date: 02/26/2022   PT End of Session - 02/26/22 9924     Visit Number 8    Number of Visits 10    Date for PT Re-Evaluation 02/28/22    Authorization Type [employer] HMO, no VL; no auth    Progress Note Due on Visit 10    PT Start Time 0812    PT Stop Time 0855    PT Time Calculation (min) 43 min    Activity Tolerance Patient tolerated treatment well    Behavior During Therapy WFL for tasks assessed/performed                 Past Medical History:  Diagnosis Date   Depression    High cholesterol    Hypertension    Past Surgical History:  Procedure Laterality Date   CHOLECYSTECTOMY     TONSILLECTOMY     There are no problems to display for this patient.   PCP:  Leslie Andrea, MD     REFERRING PROVIDER: PT eval/tx for hx of falling, to improve gait mobility and strength, lt hip pain per Carlis Abbott, NP   REFERRING DIAG: PT eval/tx for hx of falling, to improve gait mobility and strength, lt hip pain per Carlis Abbott, NP   THERAPY DIAG:  Difficulty in walking, not elsewhere classified  Other abnormalities of gait and mobility  Pain in left hip  Rationale for Evaluation and Treatment Rehabilitation  ONSET DATE: years  SUBJECTIVE:   SUBJECTIVE STATEMENT: "My knees hurt a little today"; feels like I'm getting around a little better  Delayed start as confused where to go this morning.  No reports of pain currently, was a little sore following last session.  Does have soreness following sitting for prolonged periods of time.    PERTINENT HISTORY: R THA 4 years ago Has some hand and shoulder pain  PAIN:  Are you having pain? Yes: NPRS scale: 4/10 Pain location: knees Pain description: achey Aggravating factors: standing, walking prolonged period of time Relieving factors:  Advil  PRECAUTIONS: None  WEIGHT BEARING RESTRICTIONS No  FALLS:  Has patient fallen in last 6 months? No  LIVING ENVIRONMENT: Lives with: lives with their spouse Lives in: House/apartment Stairs: Yes: External: 2 steps; none Has following equipment at home: Single point cane, Environmental consultant - 4 wheeled, Electronics engineer, and Grab bars  OCCUPATION: retired  PLOF: Independent  PATIENT GOALS better balance   OBJECTIVE:   DIAGNOSTIC FINDINGS:   EXAM: DUAL X-RAY ABSORPTIOMETRY (DXA) FOR BONE MINERAL DENSITY   IMPRESSION: Your patient Gina Salazar completed a BMD test on 11/25/2021 using the [place] (software version: 14.10) manufactured by [employer]. The following summarizes the results of our evaluation. Technologist: [facility] PATIENT BIOGRAPHICAL: Name: Gina, Salazar Patient ID: [id number] Birth Date: [birth year] Height: 63.0 in. Gender: Female Exam Date: 11/25/2021 Weight: 216.0 lbs. Indications: Caucasian, Follow up Osteoporosis, Low Calcium Intake, Post Menopausal Fractures: Treatments: Asprin, Fosamax, Vitamin D DENSITOMETRY RESULTS: Site         Region     Measured Date Measured Age WHO Classification Young Adult T-score BMD         %Change vs. Previous Significant Change (*) Left Femur Neck 11[age]25/[age] 78.9 Oste[age] -1.2 0.870 g/cm2 8.6% Yes Left Femur Neck 09[age]15/2019 76.7 Osteopenia -1.7 0.801 g/cm2 - -   Left Femur Total 11/25/2021 78.9 Osteopenia -1.2 0.856 g/cm2  9.2% Yes Left Femur Total 09/15/2019 76.7 Osteopenia -1.8 0.784 g/cm2 - -   Left Forearm Radius 33% 11/25/2021 78.9 Osteoporosis -3.0 0.497 g/cm2 4.6% - Left Forearm Radius 33% 09/15/2019 76.7 Osteoporosis -3.3 0.475 g/cm2 - - ASSESSMENT: The BMD measured at Forearm Radius 33% is 0.497 g/cm2 with a T-score of -3.0. This patient is considered osteoporotic according to [facility]) criteria. The scan quality is good. Compared with the prior study on  09/15/19, the BMD of the lt. total hip shows a statistically significant increase. Lumbar spine was excluded due to advanced degenerative changes. Rt. hip excluded due to surgical repair.   World Pharmacologist [facility]) criteria for post-menopausal, Caucasian Women: Normal:       T-score at or above -1 SD Osteopenia:   T-score between -1 and -2.5 SD Osteoporosis: T-score at or below -2.5 SD   RECOMMENDATIONS: 1. All patients should optimize calcium and vitamin D intake. 2. Consider FDA-approved medical therapies in postmenopausal women and med aged 27 years and older, based on the following: a. A hip or vertebral (clinical or morphometric) fracture b. T-score< -2.5 at the femoral neck or spine after appropriate evaluation to exclude secondary causes c. Low bone mass (T-score between -1.0 and -2.5 at the femoral neck or spine) and a 10-year probability of a hip fracture > 3% or a 10-year probability of a major osteoporosis-related fracture > 20% based on the [place]-adapted WHO algorithm d. Clinician judgment and/or patient preferences may indicate treatment for people with 10-year fracture probabilities above or below these levels   FOLLOW-UP: Patients with diagnosis of osteoporosis or at high risk for fracture should have regular bone mineral density tests. For patients eligible for Medicare, routine testing is allowed once every 2 years. The testing frequency can be increased to one year for patients who have rapidly progressing disease, those who are receiving or discontinuing medical therapy to restore bone mass, or have additional risk factors.   I have reviewed this report, and agree with the above findings.   [facility]     PATIENT SURVEYS:  FOTO 52  COGNITION:  Overall cognitive status: Within functional limits for tasks assessed     SENSATION: Sometimes hands go to sleep at night   LOWER EXTREMITY MMT:  MMT Right eval Left eval Right 01/31/22  Left  01/31/22  Hip flexion _0 Hip extension 4- 4- 4- 4-  Hip abduction 4- 4 4+ 5  Hip adduction      Hip internal rotation      Hip external rotation      Knee flexion 4+ 4 4+ 4+  Knee extension _1 Ankle dorsiflexion _2 Ankle plantarflexion      Ankle inversion      Ankle eversion       (Blank rows = not tested)    FUNCTIONAL TESTS:  5 times sit to stand: 17.35 Timed up and go (TUG): not tested 2 minute walk test: 365 ft  GAIT: Distance walked: 365 ft Assistive device utilized: Single point cane Level of assistance: Modified independence Comments: R ankle instabilty    TODAY'S TREATMENT: 02/26/22 Octane x 5'  Sit to stand 2 x 10  Standing: 2# hip abduction and extension 2 x 10 Heel/toe raises with no UE support x 20 Ball squats 5" hold x 5 4" step ups x 10 each needs 1 UE assist for right step ups; none for left 4" lateral  step ups x 10 each with 1 UE assist Tandem stance on foam 2 x 30" each BTB TKE's x 30 each BTB sidestepping length of gym x ~35 ft x 4       02/18/22 STS then heel raise 10x 3" Squat then heel raise 10x 3" Paloff partial tandem stance 4x 10 with GTB Vector stance 3x 5" Step up 4in 10x each LE 1 HHA Step down 2in Rt LE Step down 4in Lt LE Tandem stance on foam 2x 30"    02/06/22 Heel raise x20 Toe raise x20 Squats front of chair no HHA 2x 10 Vector stance 3x 5" BLE Sidestep with GTB around thigh 2RT in hallway (~12 ft) Tandem stance on foam 2x 30" with intermittent HHA required Marching 10x 5" alternating with intermittent HHA Step up 4in 10x each LE 1 HHA  01/31/22 Progress note FOTO 54 5 times sit to stand 7.87 2 MWT 400 ft no AD  01/15/22 Heel raise x20 Toe raise x20 Mini squat 2 x 10  Standing hip abduction BTB 2 x 10 each Standing hip extension BTB 2 x 10 each Step up 4 inch HHA x 1  TKE BTB 20 x 5"   Octane bike 4 min EOS for mobility   01/07/22  Heel raise on incline slope 10 Toe  raise on decline slope Controlled STS no UE 10x Sidestep with GTB around thigh 2RT Tandem stance 3x 30" intermittent HHA required SLS max 3-4" prior need for HHA 5x Squat 10x Educated proper hand use with SPC for Rt knee/hip support 3 point sequence [id number]- begin walking program  Sidelying: abd 10x 5"  Supine: Bridge GTB around thigh 10x 5"   12/25/21 Bridge x 10  Clamshell 2 x 10 each SLR x 10 each  Heel raise x 10 Sit to stand no UE 1 x 10 Tandem stance 20 sec each Single leg stance 10 sec each    Physical therapy evaluation and HEP instruction   PATIENT EDUCATION:  Education details: Patient educated on exam findings, POC, scope of PT, HEP. Person educated: Patient Education method: Explanation, Demonstration, and Handouts Education comprehension: verbalized understanding, returned demonstration, verbal cues required, and tactile cues required   HOME EXERCISE PROGRAM: Access Code: [email or web address] 02/18/22:  Sidestep with theraband Begin walking program 01/15/22 - Hip Abduction with Resistance Loop  - 1-2 x daily - 7 x weekly - 2 sets - 10 reps - Hip Extension with Resistance Loop  - 1-2 x daily - 7 x weekly - 2 sets - 10 reps - Standing Terminal Knee Extension with Resistance  - 1-2 x daily - 7 x weekly - 2 sets - 10 reps   - Active Straight Leg Raise with Quad Set  - 2 x daily - 7 x weekly - 2 sets - 10 reps - Clamshell  - 2 x daily - 7 x weekly - 2 sets - 10 reps - Heel Raises with Counter Support  - 2 x daily - 7 x weekly - 2 sets - 10 reps - Sit to Stand Without Arm Support  - 2 x daily - 7 x weekly - 1-2 sets - 10 reps  URL: [email or web address] Date: 12/18/2021 Prepared by: [facility] - [facility]  Exercises - Supine Bridge  - 2 x daily - 7 x weekly - 1 sets - 10 reps - Hooklying Clamshell with Resistance  - 2 x daily - 7 x weekly - 1 sets - 10 reps - Single Leg Stance with Support  -  2 x daily - 7 x weekly - 1 sets - 10 reps - 5-10 sec hold - Standing Tandem  Balance with Counter Support  - 2 x daily - 7 x weekly - 1 sets - 10 reps - 5-10 sec hold  ASSESSMENT:  CLINICAL IMPRESSION: Continued session focus with LE strengthening and balance stability exercises.  Patient reports no hip pain during treatment today; has knee pain only.  Noted has difficulty stepping up with right leg versus left; needs upper extremity support for balance.  Needs cues to pick up feet with sidestepping but no LOB noted.  Patient will benefit from continued skilled therapy services to address deficits and promote return to optimal function.       OBJECTIVE IMPAIRMENTS Abnormal gait, cardiopulmonary status limiting activity, decreased activity tolerance, decreased balance, decreased endurance, decreased knowledge of condition, decreased knowledge of use of DME, decreased mobility, difficulty walking, decreased ROM, decreased strength, hypomobility, increased fascial restrictions, impaired perceived functional ability, impaired flexibility, and pain.   ACTIVITY LIMITATIONS carrying, lifting, bending, standing, squatting, sleeping, stairs, transfers, bed mobility, continence, bathing, locomotion level, and caring for others  PARTICIPATION LIMITATIONS: meal prep, cleaning, laundry, driving, shopping, community activity, and yard work  [place], Past/current experiences, and Time since onset of injury/illness/exacerbation are also affecting patient's functional outcome.   REHAB POTENTIAL: Good  CLINICAL DECISION MAKING: Evolving/moderate complexity  EVALUATION COMPLEXITY: Moderate   GOALS: Goals reviewed with patient? Yes  SHORT TERM GOALS: Target date: 01/08/2022   patient will be independent with initial HEP  Baseline: Goal status: MET  2.  Patient will improve 5 x STS score from 17.35 sec to 15 sec to demonstrate improved functional mobility and increased lower extremity strength.  Baseline: 7.87 01/31/22 Goal status: MET    LONG TERM GOALS:  Target date: 01/29/2022   Patient will be independent in self management strategies to improve quality of life and functional outcomes.  Baseline:  Goal status: IN PROGRESS  2.  Patient will improve 5 x STS score from 17.23 sec to 13 sec to demonstrate improved functional mobility and increased lower extremity strength.  Baseline: 7.87 01/31/22 Goal status: MET  3.  Patient will improve FOTO score to predicted value to demonstrate improved functional mobility  Baseline: 52; 01/31/22 54 (goal 55) Goal status: IN PROGRESS  4.  Patient will report at least 50% improvement in overall symptoms and/or function to demonstrate improved functional mobility  Baseline: 15% to 20% better 01/31/22 Goal status: IN PROGRESS  5.   Patient will increase bilateral lower extremity MMTs to 4+-5/5 to promote return to ambulation community distances with minimal deviation.  Baseline: see above Goal status: IN PROGRESS  PLAN: PT FREQUENCY: 1x/week  PT DURATION: 4 weeks  PLANNED INTERVENTIONS: Therapeutic exercises, Therapeutic activity, Neuromuscular re-education, Balance training, Gait training, Patient/Family education, Joint manipulation, Joint mobilization, Stair training, Orthotic/Fit training, DME instructions, Aquatic Therapy, Dry Needling, Electrical stimulation, Spinal manipulation, Spinal mobilization, Cryotherapy, Moist heat, Compression bandaging, scar mobilization, Splintting, Taping, Traction, Ultrasound, Ionotophoresis 89m/ml Dexamethasone, and Manual therapy  PLAN FOR NEXT SESSION:   Reassess next visit  9:01 AM, 02/26/22 Aiyah Scarpelli Small Conleigh Heinlein MPT [facility] [place] #[phone]:[phone]

## 2022-03-05 ENCOUNTER — Encounter (HOSPITAL_COMMUNITY): Payer: Medicare HMO

## 2022-03-06 IMAGING — MG MM DIGITAL SCREENING BILAT W/ TOMO AND CAD
8 series · 8 of 24 positions shown · non-contrast
Comparison: Previous exam(s).

CLINICAL DATA: Screening.

EXAM:
DIGITAL SCREENING BILATERAL MAMMOGRAM WITH TOMOSYNTHESIS AND CAD
TECHNIQUE: Bilateral screening digital craniocaudal and mediolateral oblique
mammograms were obtained. Bilateral screening digital breast
tomosynthesis was performed. The images were evaluated with
computer-aided detection.

[R MLO synth-2D]
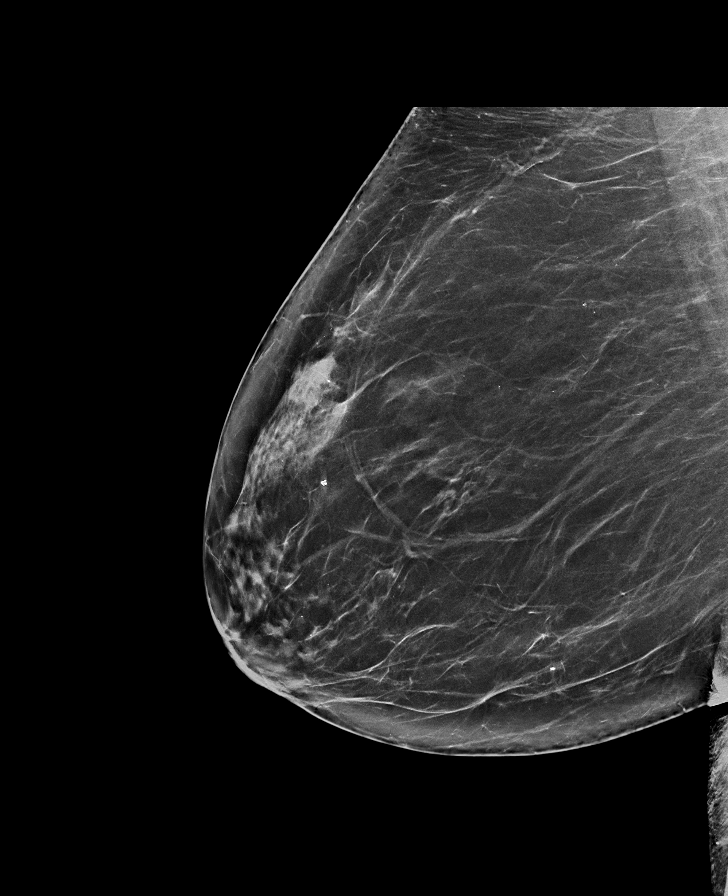

[L CC synth-2D]
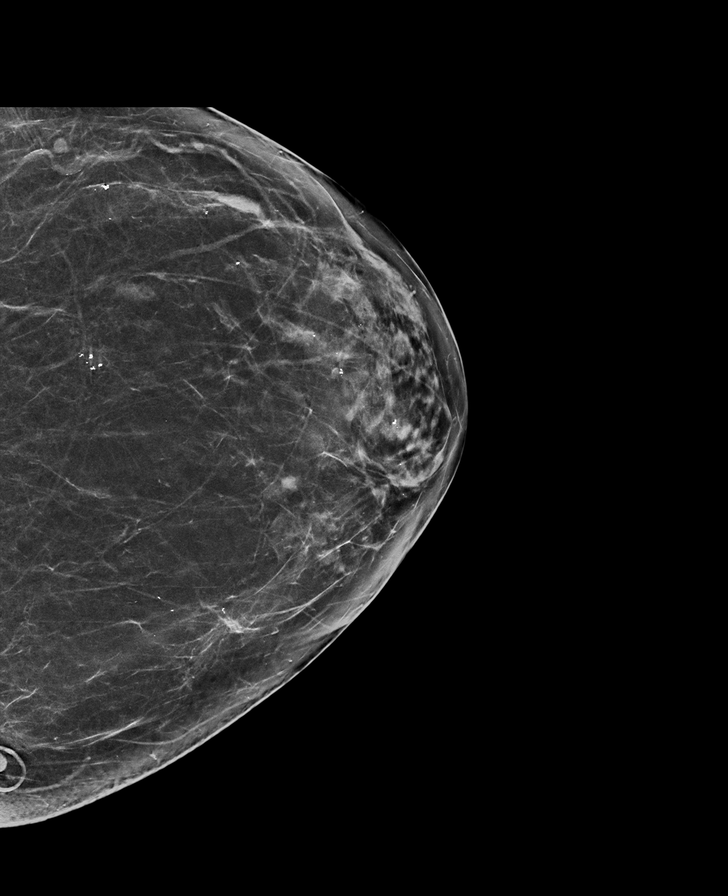

[R CC synth-2D]
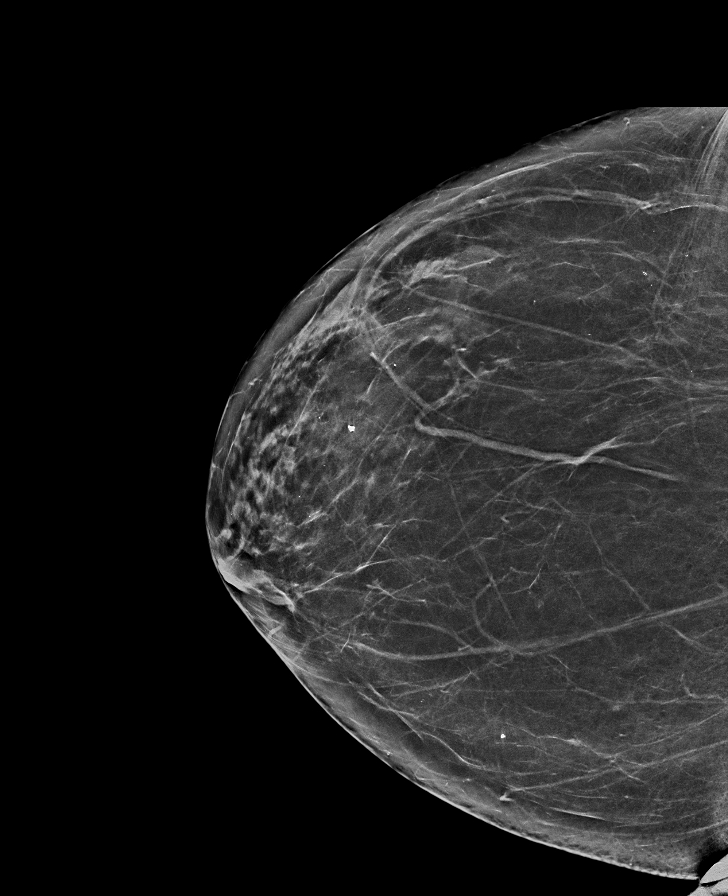

[L MLO synth-2D]
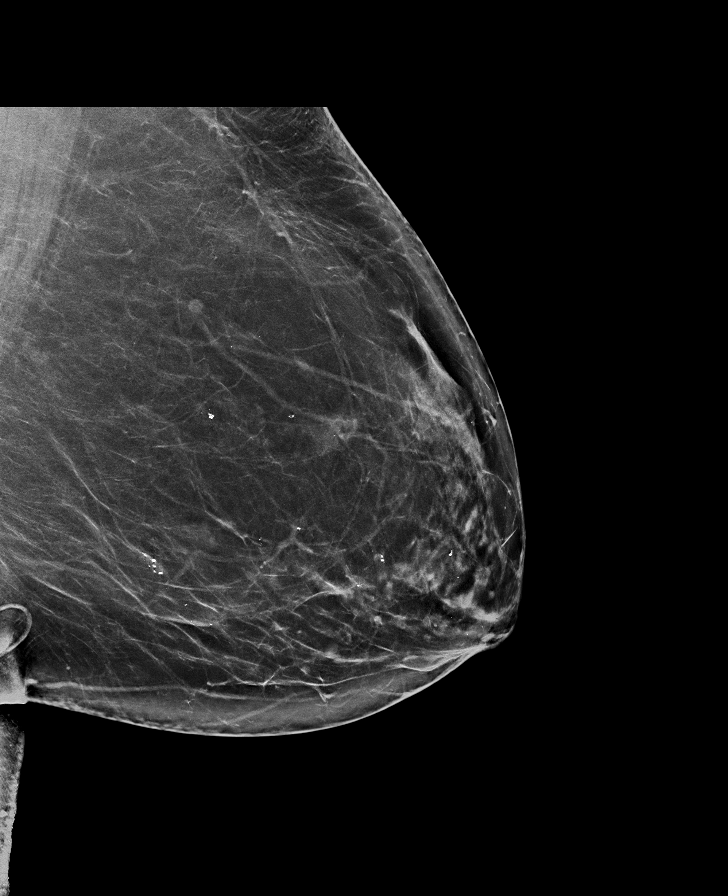

[R CC tomo · tomo slice 37/72.0]
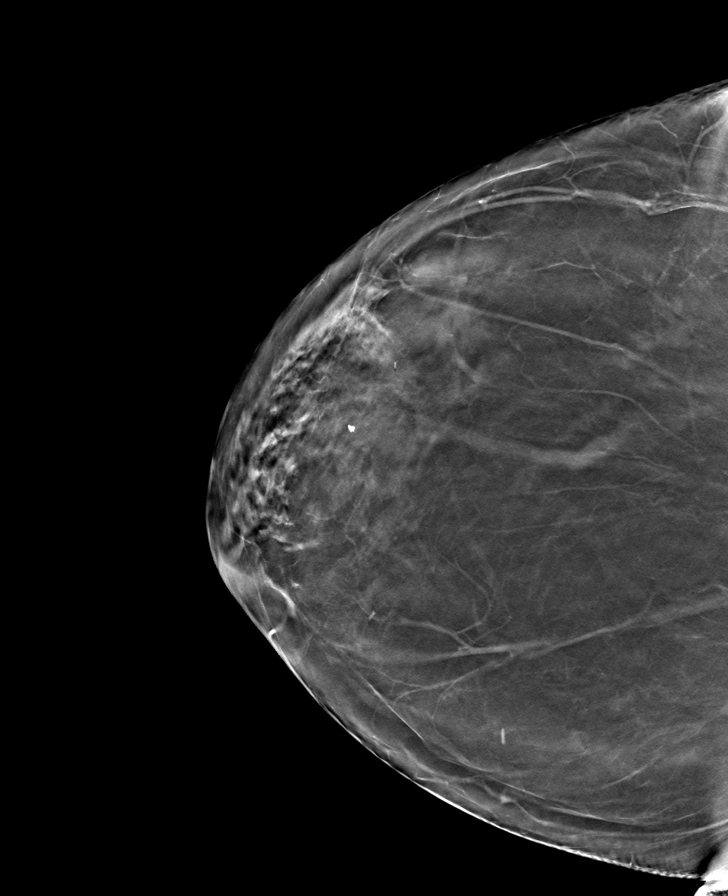

[L MLO tomo · tomo slice 42/83.0]
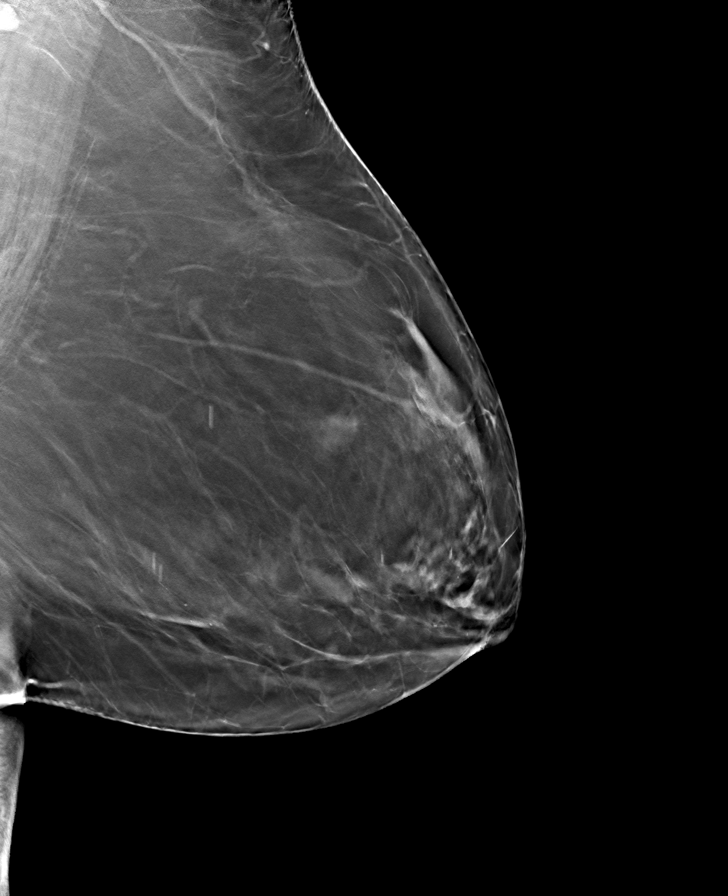

[L CC tomo · tomo slice 37/74.0]
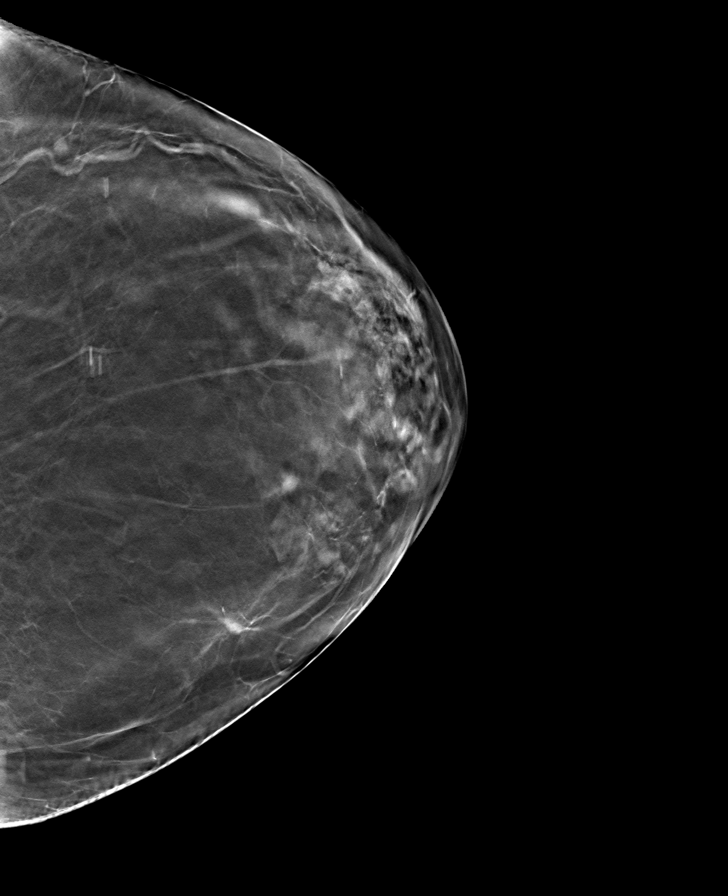

[R MLO tomo · tomo slice 41/81.0]
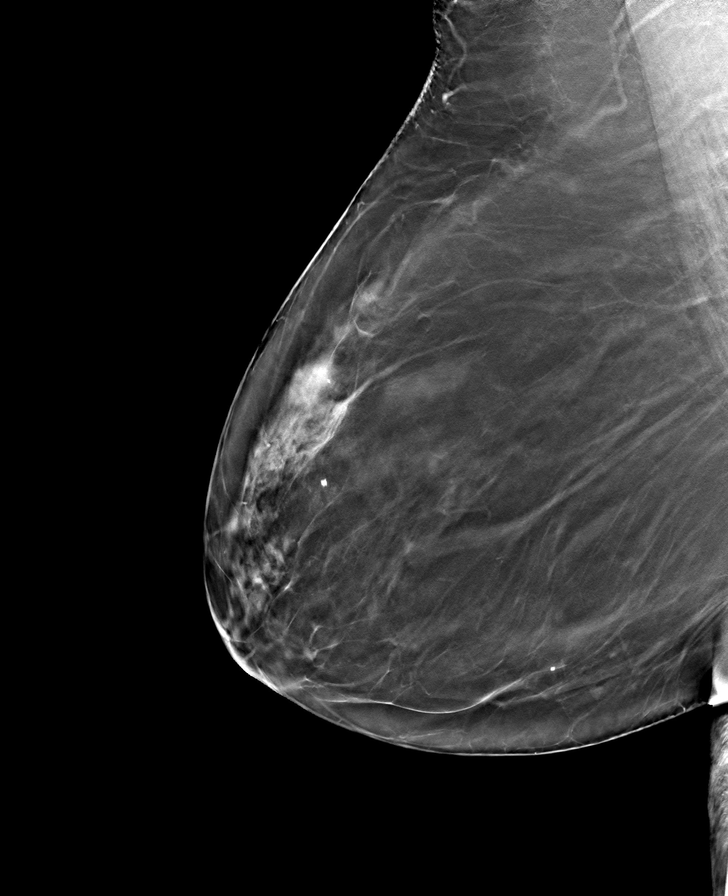

[8 of 24 positions shown; findings below may reference images not displayed]

ACR Breast Density Category b: There are scattered areas of
fibroglandular density.
FINDINGS: There are no findings suspicious for malignancy.
IMPRESSION: No mammographic evidence of malignancy. A result letter of this
screening mammogram will be mailed directly to the patient.

RECOMMENDATION:
Screening mammogram in one year. (Code:51-O-LD2)

BI-RADS CATEGORY  1: Negative.

## 2022-03-19 DIAGNOSIS — Z23 Encounter for immunization: Secondary | ICD-10-CM | POA: Diagnosis not present

## 2022-03-19 DIAGNOSIS — Z733 Stress, not elsewhere classified: Secondary | ICD-10-CM | POA: Diagnosis not present

## 2022-03-19 DIAGNOSIS — I10 Essential (primary) hypertension: Secondary | ICD-10-CM | POA: Diagnosis not present

## 2022-03-19 DIAGNOSIS — K649 Unspecified hemorrhoids: Secondary | ICD-10-CM | POA: Diagnosis not present

## 2022-03-19 DIAGNOSIS — Z9181 History of falling: Secondary | ICD-10-CM | POA: Diagnosis not present

## 2022-03-26 ENCOUNTER — Other Ambulatory Visit (HOSPITAL_COMMUNITY): Payer: Self-pay | Admitting: Family Medicine

## 2022-03-26 ENCOUNTER — Ambulatory Visit
Admission: RE | Admit: 2022-03-26 | Discharge: 2022-03-26 | Disposition: A | Discharge status: Home / Self Care | Payer: Medicare HMO | Source: Ambulatory Visit | Attending: Family Medicine | Admitting: Family Medicine

## 2022-03-26 ENCOUNTER — Ambulatory Visit: Payer: Medicare HMO

## 2022-03-26 ENCOUNTER — Ambulatory Visit (HOSPITAL_COMMUNITY)
Admission: RE | Admit: 2022-03-26 | Discharge: 2022-03-26 | Disposition: A | Discharge status: Home / Self Care | Payer: Medicare HMO | Source: Ambulatory Visit | Attending: Family Medicine | Admitting: Family Medicine

## 2022-03-26 DIAGNOSIS — M545 Low back pain, unspecified: Secondary | ICD-10-CM | POA: Insufficient documentation

## 2022-03-26 DIAGNOSIS — M5440 Lumbago with sciatica, unspecified side: Secondary | ICD-10-CM

## 2022-03-26 DIAGNOSIS — M25552 Pain in left hip: Secondary | ICD-10-CM

## 2022-03-27 ENCOUNTER — Encounter (HOSPITAL_COMMUNITY): Payer: Self-pay

## 2022-03-27 DIAGNOSIS — Z9181 History of falling: Secondary | ICD-10-CM | POA: Diagnosis not present

## 2022-03-27 DIAGNOSIS — Z96641 Presence of right artificial hip joint: Secondary | ICD-10-CM | POA: Diagnosis not present

## 2022-03-27 DIAGNOSIS — M545 Low back pain, unspecified: Secondary | ICD-10-CM | POA: Diagnosis not present

## 2022-03-27 DIAGNOSIS — M25552 Pain in left hip: Secondary | ICD-10-CM | POA: Diagnosis not present

## 2022-03-27 NOTE — Therapy (Signed)
PHYSICAL THERAPY DISCHARGE SUMMARY  Visits from Start of Care: 8  Current functional level related to goals / functional outcomes: na   Remaining deficits: na   Education / Equipment: na   Patient agrees to discharge. Patient goals were partially met. Patient is being discharged due to not returning since the last visit.

## 2022-06-19 DIAGNOSIS — M9903 Segmental and somatic dysfunction of lumbar region: Secondary | ICD-10-CM | POA: Diagnosis not present

## 2022-06-19 DIAGNOSIS — M5033 Other cervical disc degeneration, cervicothoracic region: Secondary | ICD-10-CM | POA: Diagnosis not present

## 2022-06-19 DIAGNOSIS — M5136 Other intervertebral disc degeneration, lumbar region: Secondary | ICD-10-CM | POA: Diagnosis not present

## 2022-06-19 DIAGNOSIS — M9901 Segmental and somatic dysfunction of cervical region: Secondary | ICD-10-CM | POA: Diagnosis not present

## 2022-06-25 DIAGNOSIS — M9901 Segmental and somatic dysfunction of cervical region: Secondary | ICD-10-CM | POA: Diagnosis not present

## 2022-06-25 DIAGNOSIS — M9903 Segmental and somatic dysfunction of lumbar region: Secondary | ICD-10-CM | POA: Diagnosis not present

## 2022-06-25 DIAGNOSIS — M5136 Other intervertebral disc degeneration, lumbar region: Secondary | ICD-10-CM | POA: Diagnosis not present

## 2022-06-25 DIAGNOSIS — M5033 Other cervical disc degeneration, cervicothoracic region: Secondary | ICD-10-CM | POA: Diagnosis not present

## 2022-06-26 DIAGNOSIS — M5033 Other cervical disc degeneration, cervicothoracic region: Secondary | ICD-10-CM | POA: Diagnosis not present

## 2022-06-26 DIAGNOSIS — M5136 Other intervertebral disc degeneration, lumbar region: Secondary | ICD-10-CM | POA: Diagnosis not present

## 2022-06-26 DIAGNOSIS — M9901 Segmental and somatic dysfunction of cervical region: Secondary | ICD-10-CM | POA: Diagnosis not present

## 2022-06-26 DIAGNOSIS — M9903 Segmental and somatic dysfunction of lumbar region: Secondary | ICD-10-CM | POA: Diagnosis not present

## 2022-07-01 DIAGNOSIS — M5136 Other intervertebral disc degeneration, lumbar region: Secondary | ICD-10-CM | POA: Diagnosis not present

## 2022-07-01 DIAGNOSIS — M9903 Segmental and somatic dysfunction of lumbar region: Secondary | ICD-10-CM | POA: Diagnosis not present

## 2022-07-01 DIAGNOSIS — M9901 Segmental and somatic dysfunction of cervical region: Secondary | ICD-10-CM | POA: Diagnosis not present

## 2022-07-01 DIAGNOSIS — M5033 Other cervical disc degeneration, cervicothoracic region: Secondary | ICD-10-CM | POA: Diagnosis not present

## 2022-07-03 DIAGNOSIS — M9903 Segmental and somatic dysfunction of lumbar region: Secondary | ICD-10-CM | POA: Diagnosis not present

## 2022-07-03 DIAGNOSIS — M5136 Other intervertebral disc degeneration, lumbar region: Secondary | ICD-10-CM | POA: Diagnosis not present

## 2022-07-03 DIAGNOSIS — M9901 Segmental and somatic dysfunction of cervical region: Secondary | ICD-10-CM | POA: Diagnosis not present

## 2022-07-03 DIAGNOSIS — M5033 Other cervical disc degeneration, cervicothoracic region: Secondary | ICD-10-CM | POA: Diagnosis not present

## 2022-07-09 DIAGNOSIS — F419 Anxiety disorder, unspecified: Secondary | ICD-10-CM | POA: Diagnosis not present

## 2022-07-09 DIAGNOSIS — E785 Hyperlipidemia, unspecified: Secondary | ICD-10-CM | POA: Diagnosis not present

## 2022-07-09 DIAGNOSIS — M9901 Segmental and somatic dysfunction of cervical region: Secondary | ICD-10-CM | POA: Diagnosis not present

## 2022-07-09 DIAGNOSIS — M5136 Other intervertebral disc degeneration, lumbar region: Secondary | ICD-10-CM | POA: Diagnosis not present

## 2022-07-09 DIAGNOSIS — M5033 Other cervical disc degeneration, cervicothoracic region: Secondary | ICD-10-CM | POA: Diagnosis not present

## 2022-07-09 DIAGNOSIS — F3341 Major depressive disorder, recurrent, in partial remission: Secondary | ICD-10-CM | POA: Diagnosis not present

## 2022-07-09 DIAGNOSIS — M9903 Segmental and somatic dysfunction of lumbar region: Secondary | ICD-10-CM | POA: Diagnosis not present

## 2022-07-09 DIAGNOSIS — I10 Essential (primary) hypertension: Secondary | ICD-10-CM | POA: Diagnosis not present

## 2022-07-10 DIAGNOSIS — M9901 Segmental and somatic dysfunction of cervical region: Secondary | ICD-10-CM | POA: Diagnosis not present

## 2022-07-10 DIAGNOSIS — M5033 Other cervical disc degeneration, cervicothoracic region: Secondary | ICD-10-CM | POA: Diagnosis not present

## 2022-07-10 DIAGNOSIS — M5136 Other intervertebral disc degeneration, lumbar region: Secondary | ICD-10-CM | POA: Diagnosis not present

## 2022-07-10 DIAGNOSIS — M9903 Segmental and somatic dysfunction of lumbar region: Secondary | ICD-10-CM | POA: Diagnosis not present

## 2022-07-16 DIAGNOSIS — M5033 Other cervical disc degeneration, cervicothoracic region: Secondary | ICD-10-CM | POA: Diagnosis not present

## 2022-07-16 DIAGNOSIS — M9901 Segmental and somatic dysfunction of cervical region: Secondary | ICD-10-CM | POA: Diagnosis not present

## 2022-07-16 DIAGNOSIS — M9903 Segmental and somatic dysfunction of lumbar region: Secondary | ICD-10-CM | POA: Diagnosis not present

## 2022-07-16 DIAGNOSIS — M5136 Other intervertebral disc degeneration, lumbar region: Secondary | ICD-10-CM | POA: Diagnosis not present

## 2022-07-17 DIAGNOSIS — M9901 Segmental and somatic dysfunction of cervical region: Secondary | ICD-10-CM | POA: Diagnosis not present

## 2022-07-17 DIAGNOSIS — M9903 Segmental and somatic dysfunction of lumbar region: Secondary | ICD-10-CM | POA: Diagnosis not present

## 2022-07-17 DIAGNOSIS — M5136 Other intervertebral disc degeneration, lumbar region: Secondary | ICD-10-CM | POA: Diagnosis not present

## 2022-07-17 DIAGNOSIS — M5033 Other cervical disc degeneration, cervicothoracic region: Secondary | ICD-10-CM | POA: Diagnosis not present

## 2022-07-21 DIAGNOSIS — M5136 Other intervertebral disc degeneration, lumbar region: Secondary | ICD-10-CM | POA: Diagnosis not present

## 2022-07-21 DIAGNOSIS — M5033 Other cervical disc degeneration, cervicothoracic region: Secondary | ICD-10-CM | POA: Diagnosis not present

## 2022-07-21 DIAGNOSIS — M9901 Segmental and somatic dysfunction of cervical region: Secondary | ICD-10-CM | POA: Diagnosis not present

## 2022-07-21 DIAGNOSIS — M9903 Segmental and somatic dysfunction of lumbar region: Secondary | ICD-10-CM | POA: Diagnosis not present

## 2022-08-01 ENCOUNTER — Ambulatory Visit (INDEPENDENT_AMBULATORY_CARE_PROVIDER_SITE_OTHER): Payer: Medicare HMO | Admitting: Internal Medicine

## 2022-08-01 ENCOUNTER — Encounter: Payer: Self-pay | Admitting: Internal Medicine

## 2022-08-01 VITALS — BP 138/80 | HR 97 | Ht 63.0 in | Wt 212.4 lb

## 2022-08-01 DIAGNOSIS — Z131 Encounter for screening for diabetes mellitus: Secondary | ICD-10-CM | POA: Diagnosis not present

## 2022-08-01 DIAGNOSIS — Z0001 Encounter for general adult medical examination with abnormal findings: Secondary | ICD-10-CM | POA: Diagnosis not present

## 2022-08-01 DIAGNOSIS — Z139 Encounter for screening, unspecified: Secondary | ICD-10-CM | POA: Diagnosis not present

## 2022-08-01 DIAGNOSIS — M810 Age-related osteoporosis without current pathological fracture: Secondary | ICD-10-CM

## 2022-08-01 DIAGNOSIS — I10 Essential (primary) hypertension: Secondary | ICD-10-CM

## 2022-08-01 DIAGNOSIS — Z23 Encounter for immunization: Secondary | ICD-10-CM | POA: Diagnosis not present

## 2022-08-01 DIAGNOSIS — F32A Depression, unspecified: Secondary | ICD-10-CM | POA: Diagnosis not present

## 2022-08-01 DIAGNOSIS — E669 Obesity, unspecified: Secondary | ICD-10-CM | POA: Diagnosis not present

## 2022-08-01 DIAGNOSIS — Z1283 Encounter for screening for malignant neoplasm of skin: Secondary | ICD-10-CM

## 2022-08-01 DIAGNOSIS — E785 Hyperlipidemia, unspecified: Secondary | ICD-10-CM

## 2022-08-01 DIAGNOSIS — J45909 Unspecified asthma, uncomplicated: Secondary | ICD-10-CM | POA: Diagnosis not present

## 2022-08-01 DIAGNOSIS — Z1329 Encounter for screening for other suspected endocrine disorder: Secondary | ICD-10-CM | POA: Diagnosis not present

## 2022-08-01 DIAGNOSIS — F419 Anxiety disorder, unspecified: Secondary | ICD-10-CM | POA: Diagnosis not present

## 2022-08-01 NOTE — Assessment & Plan Note (Signed)
PCV 20 administered today 

## 2022-08-01 NOTE — Patient Instructions (Addendum)
It was a pleasure to see you today.  Thank you for giving [place] the opportunity to be involved in your care.  Below is a brief recap of your visit and next steps.  We will plan to see you again in 6 months.  Summary You have established care today We will check basic labs and request records from Dr. Michelle Nasuti office You will receive your pneumococcal vaccine Referrals placed to dermatology and nutrition Follow up in 6 months    Clotrimazole Cream - antifungal cream

## 2022-08-01 NOTE — Assessment & Plan Note (Signed)
She endorses a past medical history significant for anxiety and depression.  Stress is initially related to her family.  She is currently prescribed Wellbutrin 300 mg daily and Ativan 1 mg daily as needed for anxiety relief. -No medication changes today

## 2022-08-01 NOTE — Assessment & Plan Note (Signed)
She endorses a history of hyperlipidemia and is currently prescribed atorvastatin 10 mg daily. -Repeat lipid panel ordered today

## 2022-08-01 NOTE — Assessment & Plan Note (Signed)
Presenting today to establish care.  Available records and labs have been reviewed. -Baseline labs ordered today, including one-time HCV screening -PCV 20 administered today -Records from her previous PCP have been requested -Referral was placed to medical nutrition therapy and dermatology -We will tentatively plan for follow-up in 6 months

## 2022-08-01 NOTE — Assessment & Plan Note (Signed)
Previously document history of osteoporosis.  She is currently prescribed Fosamax 70 mg weekly and is also on vitamin D + calcium supplementation.  States that she has been on Fosamax for 2 years. -No medication changes today. -Repeat vitamin D level ordered

## 2022-08-01 NOTE — Assessment & Plan Note (Signed)
Previously documented history of essential hypertension.  She is currently prescribed losartan 100 mg daily.  BP today is 138/80. -No medication changes today

## 2022-08-01 NOTE — Progress Notes (Signed)
New Patient Office Visit  Subjective    Patient ID: Daejha Hemming, female    DOB: December 16, 1940  Age: 82 y.o. MRN: [id number]  CC:  Chief Complaint  Patient presents with   Establish Care   HPI Chyane Bolin presents to establish care.  She is a 82 year old woman who endorses a past medical history significant for anxiety and depression, HTN, osteoporosis, HLD, seasonal allergies, and obesity.  She was previously followed by Dr. Sudie Bailey. Currently retired. Denies tobacco, alcohol, and illicit drug use. Ms. Rhodd reports feeling fairly well today.  She requests a dermatology referral for routine skin examination.  She has no additional concerns to discuss today.  Chronic medical conditions and outstanding preventative care items discussed today are individually addressed A/P below.  Outpatient Encounter Medications as of 08/01/2022  Medication Sig   acetaminophen (TYLENOL) 325 MG tablet Take 650 mg by mouth every 6 (six) hours as needed.   albuterol (VENTOLIN HFA) 108 (90 Base) MCG/ACT inhaler Inhale into the lungs.   alendronate (FOSAMAX) 70 MG tablet Take 70 mg by mouth once a week.   atorvastatin (LIPITOR) 10 MG tablet Take 10 mg by mouth daily.   BUPROPION HCL PO Take by mouth.   calcium carbonate (OSCAL) 1500 (600 Ca) MG TABS tablet Take 600 mg of elemental calcium by mouth daily with breakfast.   Cholecalciferol (D-3-5) 125 MCG (5000 UT) capsule Take 5,000 Units by mouth daily.   ibuprofen (ADVIL) 100 MG tablet Take 100 mg by mouth every 6 (six) hours as needed for fever.   LORazepam (ATIVAN) 1 MG tablet Take 1 mg by mouth daily as needed.   losartan (COZAAR) 100 MG tablet Take 100 mg by mouth daily.   No facility-administered encounter medications on file as of 08/01/2022.    Past Medical History:  Diagnosis Date   Depression    High cholesterol    Hypertension     Past Surgical History:  Procedure Laterality Date   CHOLECYSTECTOMY     TONSILLECTOMY     TOTAL HIP  ARTHROPLASTY Right     Family History  Problem Relation Age of Onset   Breast cancer Maternal Aunt    Breast cancer Maternal Uncle     Social History   Socioeconomic History   Marital status: Married    Spouse name: Not on file   Number of children: Not on file   Years of education: Not on file   Highest education level: Not on file  Occupational History   Not on file  Tobacco Use   Smoking status: Former    Years: 4    Types: Cigarettes   Smokeless tobacco: Never  Vaping Use   Vaping Use: Never used  Substance and Sexual Activity   Alcohol use: Never   Drug use: Never   Sexual activity: Not on file  Other Topics Concern   Not on file  Social History Narrative   Not on file   Social Determinants of Health   Financial Resource Strain: Not on file  Food Insecurity: Not on file  Transportation Needs: Not on file  Physical Activity: Not on file  Stress: Not on file  Social Connections: Not on file  Intimate Partner Violence: Not on file    Review of Systems  Constitutional:  Negative for chills and fever.  HENT:  Negative for sore throat.   Respiratory:  Negative for cough and shortness of breath.   Cardiovascular:  Negative for chest pain, palpitations and leg swelling.  Gastrointestinal:  Negative for abdominal pain, blood in stool, constipation, diarrhea, nausea and vomiting.  Genitourinary:  Negative for dysuria and hematuria.  Musculoskeletal:  Negative for myalgias.  Skin:  Negative for itching and rash.  Neurological:  Negative for dizziness and headaches.  Psychiatric/Behavioral:  Positive for depression. Negative for suicidal ideas. The patient is nervous/anxious.      Objective    BP 138/80   Pulse 97   Ht 5\' 3"  (1.6 m)   Wt 212 lb 6.4 oz (96.3 kg)   SpO2 94%   BMI 37.62 kg/m   Physical Exam Vitals reviewed.  Constitutional:      General: She is not in acute distress.    Appearance: Normal appearance. She is not toxic-appearing.  HENT:      Head: Normocephalic and atraumatic.     Right Ear: External ear normal.     Left Ear: External ear normal.     Nose: Nose normal. No congestion or rhinorrhea.     Mouth/Throat:     Mouth: Mucous membranes are moist.     Pharynx: Oropharynx is clear. No oropharyngeal exudate or posterior oropharyngeal erythema.  Eyes:     General: No scleral icterus.    Extraocular Movements: Extraocular movements intact.     Conjunctiva/sclera: Conjunctivae normal.     Pupils: Pupils are equal, round, and reactive to light.  Cardiovascular:     Rate and Rhythm: Normal rate and regular rhythm.     Pulses: Normal pulses.     Heart sounds: Normal heart sounds. No murmur heard.    No friction rub. No gallop.  Pulmonary:     Effort: Pulmonary effort is normal.     Breath sounds: Normal breath sounds. No wheezing, rhonchi or rales.  Abdominal:     General: Abdomen is flat. Bowel sounds are normal. There is no distension.     Palpations: Abdomen is soft.     Tenderness: There is no abdominal tenderness.  Musculoskeletal:        General: No swelling. Normal range of motion.     Cervical back: Normal range of motion.     Right lower leg: No edema.     Left lower leg: No edema.  Lymphadenopathy:     Cervical: No cervical adenopathy.  Skin:    General: Skin is warm and dry.     Capillary Refill: Capillary refill takes less than 2 seconds.     Coloration: Skin is not jaundiced.  Neurological:     General: No focal deficit present.     Mental Status: She is alert and oriented to person, place, and time.     Gait: Gait abnormal (Ambulates with a cane).  Psychiatric:        Mood and Affect: Mood normal.        Behavior: Behavior normal.    Assessment & Plan:   Problem List Items Addressed This Visit       Essential hypertension    Previously documented history of essential hypertension.  She is currently prescribed losartan 100 mg daily.  BP today is 138/80. -No medication changes today       Asthma due to seasonal allergies    Has an albuterol inhaler for as needed symptom relief.  Her pulmonary exam today is unremarkable. -No medication changes today      Osteoporosis    Previously document history of osteoporosis.  She is currently prescribed Fosamax 70 mg weekly and is also on vitamin D + calcium  supplementation.  States that she has been on Fosamax for 2 years. -No medication changes today. -Repeat vitamin D level ordered      Anxiety and depression    She endorses a past medical history significant for anxiety and depression.  Stress is initially related to her family.  She is currently prescribed Wellbutrin 300 mg daily and Ativan 1 mg daily as needed for anxiety relief. -No medication changes today      Hyperlipidemia    She endorses a history of hyperlipidemia and is currently prescribed atorvastatin 10 mg daily. -Repeat lipid panel ordered today      Obesity (BMI 30-39.9)    BMI 37.6.  She is aware of the need to lose weight and is focused on making lifestyle modifications. -Medical nutrition therapy referral placed today at patient's request      Encounter for general adult medical examination with abnormal findings - Primary    Presenting today to establish care.  Available records and labs have been reviewed. -Baseline labs ordered today, including one-time HCV screening -PCV 20 administered today -Records from her previous PCP have been requested -Referral was placed to medical nutrition therapy and dermatology -We will tentatively plan for follow-up in 6 months      Skin cancer screening    Dermatology referral placed at patient's request      Need for pneumococcal 20-valent conjugate vaccination    PCV 20 administered today      Return in about 6 months (around 02/01/2023).   Billie Lade, MD

## 2022-08-01 NOTE — Assessment & Plan Note (Signed)
Has an albuterol inhaler for as needed symptom relief.  Her pulmonary exam today is unremarkable. -No medication changes today

## 2022-08-01 NOTE — Assessment & Plan Note (Signed)
BMI 37.6.  She is aware of the need to lose weight and is focused on making lifestyle modifications. -Medical nutrition therapy referral placed today at patient's request

## 2022-08-01 NOTE — Assessment & Plan Note (Signed)
Dermatology referral placed at patient's request 

## 2022-08-02 LAB — CMP14+EGFR
ALT: 14 IU/L (ref 0–32)
AST: 15 IU/L (ref 0–40)
Albumin/Globulin Ratio: 2.3 — ABNORMAL HIGH (ref 1.2–2.2)
Albumin: 4.3 g/dL (ref 3.8–4.8)
Alkaline Phosphatase: 65 IU/L (ref 44–121)
BUN/Creatinine Ratio: 14 (ref 12–28)
BUN: 13 mg/dL (ref 8–27)
Bilirubin Total: 0.4 mg/dL (ref 0.0–1.2)
CO2: 23 mmol/L (ref 20–29)
Calcium: 9.6 mg/dL (ref 8.7–10.3)
Chloride: 103 mmol/L (ref 96–106)
Creatinine, Ser: 0.91 mg/dL (ref 0.57–1.00)
Globulin, Total: 1.9 g/dL (ref 1.5–4.5)
Glucose: 120 mg/dL — ABNORMAL HIGH (ref 70–99)
Potassium: 4.1 mmol/L (ref 3.5–5.2)
Sodium: 142 mmol/L (ref 134–144)
Total Protein: 6.2 g/dL (ref 6.0–8.5)
eGFR: 64 mL/min/{1.73_m2} (ref >59)

## 2022-08-02 LAB — CBC WITH DIFFERENTIAL/PLATELET
Basophils Absolute: 0.1 10*3/uL (ref 0.0–0.2)
Basos: 1 %
EOS (ABSOLUTE): 0.2 10*3/uL (ref 0.0–0.4)
Eos: 3 %
Hematocrit: 40.4 % (ref 34.0–46.6)
Hemoglobin: 13.3 g/dL (ref 11.1–15.9)
Immature Grans (Abs): 0 10*3/uL (ref 0.0–0.1)
Immature Granulocytes: 0 %
Lymphocytes Absolute: 1.3 10*3/uL (ref 0.7–3.1)
Lymphs: 17 %
MCH: 32.5 pg (ref 26.6–33.0)
MCHC: 32.9 g/dL (ref 31.5–35.7)
MCV: 99 fL — ABNORMAL HIGH (ref 79–97)
Monocytes Absolute: 0.7 10*3/uL (ref 0.1–0.9)
Monocytes: 9 %
Neutrophils Absolute: 5.4 10*3/uL (ref 1.4–7.0)
Neutrophils: 70 %
Platelets: 284 10*3/uL (ref 150–450)
RBC: 4.09 x10E6/uL (ref 3.77–5.28)
RDW: 11.7 % (ref 11.7–15.4)
WBC: 7.7 10*3/uL (ref 3.4–10.8)

## 2022-08-02 LAB — HCV INTERPRETATION

## 2022-08-02 LAB — TSH+FREE T4
Free T4: 1.31 ng/dL (ref 0.82–1.77)
TSH: 1.97 u[IU]/mL (ref 0.450–4.500)

## 2022-08-02 LAB — HEMOGLOBIN A1C
Est. average glucose Bld gHb Est-mCnc: 120 mg/dL
Hgb A1c MFr Bld: 5.8 % — ABNORMAL HIGH (ref 4.8–5.6)

## 2022-08-02 LAB — LIPID PANEL
Chol/HDL Ratio: 2.5 ratio (ref 0.0–4.4)
Cholesterol, Total: 178 mg/dL (ref 100–199)
HDL: 71 mg/dL (ref >39)
LDL Chol Calc (NIH): 87 mg/dL (ref 0–99)
Triglycerides: 111 mg/dL (ref 0–149)
VLDL Cholesterol Cal: 20 mg/dL (ref 5–40)

## 2022-08-02 LAB — B12 AND FOLATE PANEL
Folate: 9.7 ng/mL (ref >3.0)
Vitamin B-12: 358 pg/mL (ref 232–1245)

## 2022-08-02 LAB — HCV AB W REFLEX TO QUANT PCR: HCV Ab: NONREACTIVE

## 2022-08-02 LAB — VITAMIN D 25 HYDROXY (VIT D DEFICIENCY, FRACTURES): Vit D, 25-Hydroxy: 49.7 ng/mL (ref 30.0–100.0)

## 2022-08-12 ENCOUNTER — Ambulatory Visit: Payer: Medicare HMO

## 2022-08-19 ENCOUNTER — Ambulatory Visit: Payer: Medicare HMO

## 2022-08-27 ENCOUNTER — Telehealth: Payer: Self-pay

## 2022-08-27 ENCOUNTER — Ambulatory Visit: Payer: Medicare HMO

## 2022-08-27 NOTE — Telephone Encounter (Signed)
Attempted to contact patient on number in chart several times. No answer. Left messages. No answer on contacts number.

## 2022-09-02 ENCOUNTER — Ambulatory Visit (INDEPENDENT_AMBULATORY_CARE_PROVIDER_SITE_OTHER): Payer: Medicare HMO | Admitting: Internal Medicine

## 2022-09-02 ENCOUNTER — Ambulatory Visit (HOSPITAL_COMMUNITY)
Admission: RE | Admit: 2022-09-02 | Discharge: 2022-09-02 | Disposition: A | Discharge status: Home / Self Care | Payer: Medicare HMO | Source: Ambulatory Visit | Attending: Internal Medicine | Admitting: Internal Medicine

## 2022-09-02 ENCOUNTER — Encounter: Payer: Self-pay | Admitting: Internal Medicine

## 2022-09-02 VITALS — BP 118/68 | HR 103 | Ht 63.0 in | Wt 210.4 lb

## 2022-09-02 DIAGNOSIS — M79605 Pain in left leg: Secondary | ICD-10-CM | POA: Diagnosis not present

## 2022-09-02 DIAGNOSIS — M79662 Pain in left lower leg: Secondary | ICD-10-CM

## 2022-09-02 NOTE — Patient Instructions (Signed)
It was a pleasure to see you today.  Thank you for giving [place] the opportunity to be involved in your care.  Below is a brief recap of your visit and next steps.   Summary Vascular ultrasound of the left leg ordered today Further management pending results

## 2022-09-02 NOTE — Assessment & Plan Note (Signed)
Presenting today for an acute visit endorsing a recent history of left calf pain.  Pain is gradually worsening.  There is tenderness palpation of the left calf.  No significant erythema or edema is appreciated.  She has not been able to identify exacerbating or alleviating factors.  She endorses a remote history of a "blood clot" but cannot provide further details.  No recent long distance travel or immobilization of the left lower extremity.   -Vascular ultrasound of the left lower extremity ordered to exclude DVT -Further management pending imaging results

## 2022-09-02 NOTE — Progress Notes (Signed)
   Acute Office Visit  Subjective:     Patient ID: Gina Salazar, female    DOB: [birth year], 82 y.o.   MRN: [id number]  Chief Complaint  Patient presents with   Leg Pain    Left leg pain since 08/26/2022. Noticed a bump on calf area of left leg.   Gina Salazar presents today for an acute visit endorsing left lower extremity pain.  Pain has developed over the last few days and has gradually worsened.  Pain is located in the left calf.  She has not been able to identify exacerbating or alleviating factors.  There was no inciting event or trauma at the onset of pain.  She states that she has appreciated "lumps and bumps" in her left calf that are not present in her right calf.  She further denies associated symptoms such as fever/chills, chest pain, and shortness of breath.  Not currently on anticoagulation.  Started taking ASA 81 mg daily 2 days ago.  Review of Systems  Musculoskeletal:  Positive for myalgias (Left calf pain).      Objective:    BP 118/68   Pulse (!) 103   Ht 5\' 3"  (1.6 m)   Wt 210 lb 6.4 oz (95.4 kg)   SpO2 95%   BMI 37.27 kg/m   Physical Exam Vitals reviewed.  Constitutional:      General: She is not in acute distress.    Appearance: Normal appearance. She is not Salazar-appearing.  Musculoskeletal:        General: Tenderness (TTP over the left calf will) present.     Comments: No obvious deformity on inspection of the left leg.  There is no erythema or edema compared to the right leg.  There is tenderness to palpation over the left calf.  Neurological:     Mental Status: She is alert.       Assessment & Plan:   Problem List Items Addressed This Visit       Pain of left calf    Presenting today for an acute visit endorsing a recent history of left calf pain.  Pain is gradually worsening.  There is tenderness palpation of the left calf.  No significant erythema or edema is appreciated.  She has not been able to identify exacerbating or alleviating factors.   She endorses a remote history of a "blood clot" but cannot provide further details.  No recent long distance travel or immobilization of the left lower extremity.   -Vascular ultrasound of the left lower extremity ordered to exclude DVT -Further management pending imaging results      Return if symptoms worsen or fail to improve.  Billie Lade, MD

## 2022-09-11 ENCOUNTER — Encounter: Payer: Self-pay | Admitting: Nutrition

## 2022-09-11 ENCOUNTER — Encounter: Payer: Medicare HMO | Attending: Internal Medicine | Admitting: Nutrition

## 2022-09-11 VITALS — Ht 63.0 in | Wt 211.0 lb

## 2022-09-11 DIAGNOSIS — E669 Obesity, unspecified: Secondary | ICD-10-CM | POA: Insufficient documentation

## 2022-09-11 NOTE — Progress Notes (Signed)
Medical Nutrition Therapy  Appointment Start time:  1300  Appointment End time:  1400  Primary concerns today: Obesity  Referral diagnosis: E66.9, Preferred learning style: No preference  Learning readiness: Ready    NUTRITION ASSESSMENT  82 yr old wfemale referred for obesity and prediabetes.A1C 5.8%. Has tried to lose weight and can't seem to lose any. Wants to learn how to eat healthier to lose weight. PMH: HTN, Asthma, Osteoporosis, Depression, Anxiety, Hyperlipidemia, Pre Dm  She notes she has mobility issues and is walking with a cane due to her knees. She can't stand or walk for very long without them 'buckeling.'  She notes she is an emotional eater. Has issues with depression and anxiety some and on treatments.  She is willing to work on eating a whole plant based diet and focus on lifestyle medicine. She wants to prevent diabetes and would like to work on weight loss.  She is wiling to consider going back to the [facility] to do water therapy. Would like a referral for water therapy or PT to help with her chronic osteoporosis and joint pain and mobility issues.   Anthropometrics  Wt Readings from Last 3 Encounters:  09/02/22 210 lb 6.4 oz (95.4 kg)  08/01/22 212 lb 6.4 oz (96.3 kg)  10/11/20 216 lb (98 kg)   Ht Readings from Last 3 Encounters:  09/02/22 5\' 3"  (1.6 m)  08/01/22 5\' 3"  (1.6 m)  10/11/20 5\' 3"  (1.6 m)   There is no height or weight on file to calculate BMI. @BMIFA @ Facility age limit for growth %iles is 20 years. Facility age limit for growth %iles is 20 years.    Clinical Medical Hx: See Chart Medications: See chart Labs:  Lab Results  Component Value Date   HGBA1C 5.8 (H) 08/01/2022      Latest Ref Rng & Units 08/01/2022    9:16 AM 09/14/2020    8:18 AM  CMP  Glucose 70 - 99 mg/dL 161  096   BUN 8 - 27 mg/dL 13  13   Creatinine 0.45 - 1.00 mg/dL 4.09  8.11   Sodium 914 - 144 mmol/L 142  139   Potassium 3.5 - 5.2 mmol/L 4.1  3.9   Chloride 96 -  106 mmol/L 103  105   CO2 20 - 29 mmol/L 23  27   Calcium 8.7 - 10.3 mg/dL 9.6  9.5   Total Protein 6.0 - 8.5 g/dL 6.2  7.0   Total Bilirubin 0.0 - 1.2 mg/dL 0.4  0.4   Alkaline Phos 44 - 121 IU/L 65  60   AST 0 - 40 IU/L 15  18   ALT 0 - 32 IU/L 14  22    Lipid Panel     Component Value Date/Time   CHOL 178 08/01/2022 0916   TRIG 111 08/01/2022 0916   HDL 71 08/01/2022 0916   CHOLHDL 2.5 08/01/2022 0916   LDLCALC 87 08/01/2022 0916   LABVLDL 20 08/01/2022 0916    Notable Signs/Symptoms: None  Lifestyle & Dietary Hx Lives with her wife and her daughter.  Estimated daily fluid intake: 40-50 oz Supplements:  Sleep: varies Stress / self-care: has some stress with family members and her health Current average weekly physical activity: ADL  24-Hr Dietary Recall Eats 2-3 meals per day. Snacks.  water  Estimated Energy Needs Calories: 1200 Carbohydrate: 135g Protein: 90g Fat: 33g   NUTRITION DIAGNOSIS  NI-1.7 Predicted excessive energy intake As related to excess calorie intake.  As evidenced by BMI 37.   NUTRITION INTERVENTION  Nutrition education (E-1) on the following topics:  Nutrition and Pre-Diabetes education provided on My Plate, CHO counting, meal planning, portion sizes, timing of meals, avoiding snacks between meals unless having a low blood sugar, target ranges for A1C and blood sugars, signs/symptoms and treatment of hyper/hypoglycemia, monitoring blood sugars, taking medications as prescribed, benefits of exercising 30 minutes per day and prevention of complications of DM.  Lifestyle Medicine  - Whole Food, Plant Predominant Nutrition is highly recommended: Eat Plenty of vegetables, Mushrooms, fruits, Legumes, Whole Grains, Nuts, seeds in lieu of processed meats, processed snacks/pastries red meat, poultry, eggs.    -It is better to avoid simple carbohydrates including: Cakes, Sweet Desserts, Ice Cream, Soda (diet and regular), Sweet Tea, Candies, Chips,  Cookies, Store Bought Juices, Alcohol in Excess of  1-2 drinks a day, Lemonade,  Artificial Sweeteners, Doughnuts, Coffee Creamers, "Sugar-free" Products, etc, etc.  This is not a complete list.....  Exercise: If you are able: 30 -60 minutes a day ,4 days a week, or 150 minutes a week.  The longer the better.  Combine stretch, strength, and aerobic activities.  If you were told in the past that you have high risk for cardiovascular diseases, you may seek evaluation by your heart doctor prior to initiating moderate to intense exercise programs.  Handouts Provided Include  Lifestyle Medicine  Learning Style & Readiness for Change Teaching method utilized: Visual & Auditory  Demonstrated degree of understanding via: Teach Back  Barriers to learning/adherence to lifestyle change: Depression, limited mobility  Goals Established by Pt Goals  Change to eating oatmeal with blueberries for breakfast  Increase fruits, vegetables and whole grains with whole meals. Cut out processed sugar and processed foods and artifical sweeteners.   MONITORING & EVALUATION Dietary intake, weekly physical activity, and weight in 1-2 months.  Next Steps  Patient is to work on meal planning and eating more whole plant based foods.Marland Kitchen

## 2022-09-11 NOTE — Patient Instructions (Addendum)
Goals  Change to eating oatmeal with blueberries for breakfast  Increase fruits, vegetables and whole grains with whole meals. Cut out processed sugar and processed foods and artifical sweeteners.

## 2022-10-15 DIAGNOSIS — E785 Hyperlipidemia, unspecified: Secondary | ICD-10-CM | POA: Diagnosis not present

## 2022-10-15 DIAGNOSIS — F419 Anxiety disorder, unspecified: Secondary | ICD-10-CM | POA: Diagnosis not present

## 2022-10-15 DIAGNOSIS — F3341 Major depressive disorder, recurrent, in partial remission: Secondary | ICD-10-CM | POA: Diagnosis not present

## 2022-10-15 DIAGNOSIS — I10 Essential (primary) hypertension: Secondary | ICD-10-CM | POA: Diagnosis not present

## 2022-11-20 ENCOUNTER — Encounter: Payer: Medicare HMO | Attending: Internal Medicine | Admitting: Nutrition

## 2022-11-20 ENCOUNTER — Encounter: Payer: Self-pay | Admitting: Nutrition

## 2022-11-20 VITALS — Ht 63.0 in | Wt 205.4 lb

## 2022-11-20 DIAGNOSIS — R7303 Prediabetes: Secondary | ICD-10-CM | POA: Insufficient documentation

## 2022-11-20 DIAGNOSIS — E669 Obesity, unspecified: Secondary | ICD-10-CM | POA: Insufficient documentation

## 2022-11-20 NOTE — Patient Instructions (Addendum)
Go to [facility] once you get your bathing suit  Go to the [facility] once a week. Increase variety of vegetables with lunch and dinner

## 2022-11-20 NOTE — Progress Notes (Signed)
Medical Nutrition Therapy  Appointment Start time:  1300  Appointment End time:  1400  Primary concerns today: Obesity  Referral diagnosis: E66.9, Preferred learning style: No preference  Learning readiness: Ready    NUTRITION ASSESSMENT Nutrition Follow up 82 yr old wfemale referred for obesity and prediabetes.A1C 5.8%.  Has only eating fast food once since last visit.  Lost 6 lbs since last visit.  Sleeping  better. Changes made:  Change to eating oatmeal with blueberries for breakfast -done Increase fruits, vegetables and whole grains with whole meals.-done Cut out processed sugar and processed foods and artifical sweeteners.-still drinking crystal light but cut out other sweetners.Using agave.  Marland Kitchen   Anthropometrics  Wt Readings from Last 3 Encounters:  09/11/22 211 lb (95.7 kg)  09/02/22 210 lb 6.4 oz (95.4 kg)  08/01/22 212 lb 6.4 oz (96.3 kg)   Ht Readings from Last 3 Encounters:  09/11/22 5\' 3"  (1.6 m)  09/02/22 5\' 3"  (1.6 m)  08/01/22 5\' 3"  (1.6 m)   There is no height or weight on file to calculate BMI. @BMIFA @ Facility age limit for growth %iles is 20 years. Facility age limit for growth %iles is 20 years.    Clinical Medical Hx: See Chart Medications: See chart Labs:  Lab Results  Component Value Date   HGBA1C 5.8 (H) 08/01/2022      Latest Ref Rng & Units 08/01/2022    9:16 AM 09/14/2020    8:18 AM  CMP  Glucose 70 - 99 mg/dL 161  096   BUN 8 - 27 mg/dL 13  13   Creatinine 0.45 - 1.00 mg/dL 4.09  8.11   Sodium 914 - 144 mmol/L 142  139   Potassium 3.5 - 5.2 mmol/L 4.1  3.9   Chloride 96 - 106 mmol/L 103  105   CO2 20 - 29 mmol/L 23  27   Calcium 8.7 - 10.3 mg/dL 9.6  9.5   Total Protein 6.0 - 8.5 g/dL 6.2  7.0   Total Bilirubin 0.0 - 1.2 mg/dL 0.4  0.4   Alkaline Phos 44 - 121 IU/L 65  60   AST 0 - 40 IU/L 15  18   ALT 0 - 32 IU/L 14  22    Lipid Panel     Component Value Date/Time   CHOL 178 08/01/2022 0916   TRIG 111 08/01/2022 0916    HDL 71 08/01/2022 0916   CHOLHDL 2.5 08/01/2022 0916   LDLCALC 87 08/01/2022 0916   LABVLDL 20 08/01/2022 0916    Notable Signs/Symptoms: None  Lifestyle & Dietary Hx Lives with her wife and her daughter.  Estimated daily fluid intake: 40-50 oz Supplements:  Sleep: varies Stress / self-care: has some stress with family members and her health Current average weekly physical activity: ADL  24-Hr Dietary Recall Oatmeal and blueberries and nuts Sandwich with lettuce, roast BBQ Ribs 2, rice, yellow squash/zucchini and   Estimated Energy Needs Calories: 1200 Carbohydrate: 135g Protein: 90g Fat: 33g   NUTRITION DIAGNOSIS  NI-1.7 Predicted excessive energy intake As related to excess calorie intake.  As evidenced by BMI 37.   NUTRITION INTERVENTION  Nutrition education (E-1) on the following topics:  Nutrition and Pre-Diabetes education provided on My Plate, CHO counting, meal planning, portion sizes, timing of meals, avoiding snacks between meals unless having a low blood sugar, target ranges for A1C and blood sugars, signs/symptoms and treatment of hyper/hypoglycemia, monitoring blood sugars, taking medications as prescribed, benefits of exercising 30 minutes  per day and prevention of complications of DM.  Lifestyle Medicine  - Whole Food, Plant Predominant Nutrition is highly recommended: Eat Plenty of vegetables, Mushrooms, fruits, Legumes, Whole Grains, Nuts, seeds in lieu of processed meats, processed snacks/pastries red meat, poultry, eggs.    -It is better to avoid simple carbohydrates including: Cakes, Sweet Desserts, Ice Cream, Soda (diet and regular), Sweet Tea, Candies, Chips, Cookies, Store Bought Juices, Alcohol in Excess of  1-2 drinks a day, Lemonade,  Artificial Sweeteners, Doughnuts, Coffee Creamers, "Sugar-free" Products, etc, etc.  This is not a complete list.....  Exercise: If you are able: 30 -60 minutes a day ,4 days a week, or 150 minutes a week.  The longer  the better.  Combine stretch, strength, and aerobic activities.  If you were told in the past that you have high risk for cardiovascular diseases, you may seek evaluation by your heart doctor prior to initiating moderate to intense exercise programs.  Handouts Provided Include  Lifestyle Medicine  Learning Style & Readiness for Change Teaching method utilized: Visual & Auditory  Demonstrated degree of understanding via: Teach Back  Barriers to learning/adherence to lifestyle change: Depression, limited mobility  Goals Established by Pt Go to [facility] once you get your  [facility]  [facility] Increase variety of vegetables with lunch and dinner    MONITORING & EVALUATION Dietary intake, weekly physical activity, and weight in 1-2 months.  Next Steps  Patient is to work on meal planning and eating more whole plant based foods.Marland Kitchen

## 2022-12-08 ENCOUNTER — Other Ambulatory Visit: Payer: Self-pay | Admitting: Internal Medicine

## 2022-12-08 DIAGNOSIS — F419 Anxiety disorder, unspecified: Secondary | ICD-10-CM

## 2022-12-08 MED ORDER — LORAZEPAM 0.5 MG PO TABS
1.0000 mg | ORAL_TABLET | Freq: Two times a day (BID) | ORAL | 0 refills | Status: DC | PRN
Start: 2022-12-08 — End: 2023-01-21

## 2023-01-21 ENCOUNTER — Other Ambulatory Visit: Payer: Self-pay | Admitting: Internal Medicine

## 2023-01-21 DIAGNOSIS — F32A Depression, unspecified: Secondary | ICD-10-CM

## 2023-02-02 ENCOUNTER — Encounter: Payer: Self-pay | Admitting: Internal Medicine

## 2023-02-02 ENCOUNTER — Ambulatory Visit: Payer: Medicare HMO | Admitting: Internal Medicine

## 2023-02-02 VITALS — BP 132/79 | HR 97 | Ht 63.0 in | Wt 202.8 lb

## 2023-02-02 DIAGNOSIS — E669 Obesity, unspecified: Secondary | ICD-10-CM

## 2023-02-02 DIAGNOSIS — Z23 Encounter for immunization: Secondary | ICD-10-CM | POA: Diagnosis not present

## 2023-02-02 DIAGNOSIS — E782 Mixed hyperlipidemia: Secondary | ICD-10-CM | POA: Diagnosis not present

## 2023-02-02 DIAGNOSIS — R7303 Prediabetes: Secondary | ICD-10-CM

## 2023-02-02 DIAGNOSIS — I10 Essential (primary) hypertension: Secondary | ICD-10-CM | POA: Diagnosis not present

## 2023-02-02 NOTE — Progress Notes (Signed)
Established Patient Office Visit  Subjective   Patient ID: Gina Salazar, female    DOB: 1941-01-17  Age: 82 y.o. MRN: [id number]  Chief Complaint  Patient presents with   Follow-up    Follow up   Gina Salazar returns to care today for routine follow-up.  She was last evaluated by me on 6/4 for an acute visit endorsing left calf pain.  Vascular ultrasound of the left leg was ordered, which was negative for DVT.  Previously evaluated by me as a new patient presenting to establish care on 5/3.  No medication changes were made at that time and she was referred to medical nutrition therapy at her request.  In the interim, she has established care with a dietitian.  There have otherwise been no acute interval events. Gina Salazar reports feeling well today.  She is asymptomatic and has no acute concerns to discuss.  Past Medical History:  Diagnosis Date   Depression    High cholesterol    Hypertension    Past Surgical History:  Procedure Laterality Date   CHOLECYSTECTOMY     TONSILLECTOMY     TOTAL HIP ARTHROPLASTY Right    Social History   Tobacco Use   Smoking status: Former    Types: Cigarettes   Smokeless tobacco: Never  Vaping Use   Vaping status: Never Used  Substance Use Topics   Alcohol use: Never   Drug use: Never   Family History  Problem Relation Age of Onset   Breast cancer Maternal Aunt    Breast cancer Maternal Uncle    No Known Allergies  Review of Systems  Constitutional:  Negative for chills and fever.  HENT:  Negative for sore throat.   Respiratory:  Negative for cough and shortness of breath.   Cardiovascular:  Negative for chest pain, palpitations and leg swelling.  Gastrointestinal:  Negative for abdominal pain, blood in stool, constipation, diarrhea, nausea and vomiting.  Genitourinary:  Negative for dysuria and hematuria.  Musculoskeletal:  Negative for myalgias.  Skin:  Negative for itching and rash.  Neurological:  Negative for dizziness and  headaches.  Psychiatric/Behavioral:  Negative for depression and suicidal ideas.      Objective:     BP 132/79 (BP Location: Right Arm, Patient Position: Sitting, Cuff Size: Large)   Pulse 97   Ht 5\' 3"  (1.6 m)   Wt 202 lb 12.8 oz (92 kg)   SpO2 97%   BMI 35.92 kg/m  BP Readings from Last 3 Encounters:  02/02/23 132/79  09/02/22 118/68  08/01/22 138/80   Physical Exam Vitals reviewed.  Constitutional:      General: She is not in acute distress.    Appearance: Normal appearance. She is not toxic-appearing.  HENT:     Head: Normocephalic and atraumatic.     Right Ear: External ear normal.     Left Ear: External ear normal.     Nose: Nose normal. No congestion or rhinorrhea.     Mouth/Throat:     Mouth: Mucous membranes are moist.     Pharynx: Oropharynx is clear. No oropharyngeal exudate or posterior oropharyngeal erythema.  Eyes:     General: No scleral icterus.    Extraocular Movements: Extraocular movements intact.     Conjunctiva/sclera: Conjunctivae normal.     Pupils: Pupils are equal, round, and reactive to light.  Cardiovascular:     Rate and Rhythm: Normal rate and regular rhythm.     Pulses: Normal pulses.  Heart sounds: Normal heart sounds. No murmur heard.    No friction rub. No gallop.  Pulmonary:     Effort: Pulmonary effort is normal.     Breath sounds: Normal breath sounds. No wheezing, rhonchi or rales.  Abdominal:     General: Abdomen is flat. Bowel sounds are normal. There is no distension.     Palpations: Abdomen is soft.     Tenderness: There is no abdominal tenderness.  Musculoskeletal:        General: No swelling. Normal range of motion.     Cervical back: Normal range of motion.     Right lower leg: No edema.     Left lower leg: No edema.  Lymphadenopathy:     Cervical: No cervical adenopathy.  Skin:    General: Skin is warm and dry.     Capillary Refill: Capillary refill takes less than 2 seconds.     Coloration: Skin is not  jaundiced.  Neurological:     General: No focal deficit present.     Mental Status: She is alert and oriented to person, place, and time.     Gait: Gait abnormal (Ambulates with a cane).  Psychiatric:        Mood and Affect: Mood normal.        Behavior: Behavior normal.   Last CBC Lab Results  Component Value Date   WBC 7.7 08/01/2022   HGB 13.3 08/01/2022   HCT 40.4 08/01/2022   MCV 99 (H) 08/01/2022   MCH 32.5 08/01/2022   RDW 11.7 08/01/2022   PLT 284 08/01/2022   Last metabolic panel Lab Results  Component Value Date   GLUCOSE 120 (H) 08/01/2022   NA 142 08/01/2022   K 4.1 08/01/2022   CL 103 08/01/2022   CO2 23 08/01/2022   BUN 13 08/01/2022   CREATININE 0.91 08/01/2022   EGFR 64 08/01/2022   CALCIUM 9.6 08/01/2022   PROT 6.2 08/01/2022   ALBUMIN 4.3 08/01/2022   LABGLOB 1.9 08/01/2022   AGRATIO 2.3 (H) 08/01/2022   BILITOT 0.4 08/01/2022   ALKPHOS 65 08/01/2022   AST 15 08/01/2022   ALT 14 08/01/2022   ANIONGAP 7 09/14/2020   Last lipids Lab Results  Component Value Date   CHOL 178 08/01/2022   HDL 71 08/01/2022   LDLCALC 87 08/01/2022   TRIG 111 08/01/2022   CHOLHDL 2.5 08/01/2022   Last hemoglobin A1c Lab Results  Component Value Date   HGBA1C 5.8 (H) 08/01/2022   Last thyroid functions Lab Results  Component Value Date   TSH 1.970 08/01/2022   Last vitamin D Lab Results  Component Value Date   VD25OH 49.7 08/01/2022   Last vitamin B12 and Folate Lab Results  Component Value Date   VITAMINB12 358 08/01/2022   FOLATE 9.7 08/01/2022     Assessment & Plan:   Problem List Items Addressed This Visit       Essential hypertension    Remains adequately controlled on current antihypertensive regimen.  No medication changes are indicated today.      Hyperlipidemia    Lipid panel updated in May.  Total cholesterol 178 and LDL 87.  She remains on atorvastatin 10 mg daily.  No medication changes are indicated today.      Obesity (BMI  30-39.9)    She has lost 10 pounds since her last appointment in May through dietary changes.  She was congratulated on her progress today.      Prediabetes  A1c 5.8 on labs from May.  She has made significant dietary changes in light of this result and has lost 10 pounds.  Repeat A1c at follow-up in 6 months.      Need for influenza vaccination    Influenza vaccine administered today      Return in about 6 months (around 08/02/2023).   Billie Lade, MD

## 2023-02-02 NOTE — Assessment & Plan Note (Signed)
 Remains adequately controlled on current antihypertensive regimen.  No medication changes are indicated today.

## 2023-02-02 NOTE — Assessment & Plan Note (Signed)
Influenza vaccine administered today.

## 2023-02-02 NOTE — Patient Instructions (Signed)
It was a pleasure to see you today.  Thank you for giving [place] the opportunity to be involved in your care.  Below is a brief recap of your visit and next steps.  We will plan to see you again in 6 months.  Summary No medication changes today Keep up the great work with weight loss Follow up in 6 months

## 2023-02-02 NOTE — Assessment & Plan Note (Signed)
She has lost 10 pounds since her last appointment in May through dietary changes.  She was congratulated on her progress today.

## 2023-02-02 NOTE — Assessment & Plan Note (Signed)
A1c 5.8 on labs from May.  She has made significant dietary changes in light of this result and has lost 10 pounds.  Repeat A1c at follow-up in 6 months.

## 2023-02-02 NOTE — Assessment & Plan Note (Signed)
Lipid panel updated in May.  Total cholesterol 178 and LDL 87.  She remains on atorvastatin 10 mg daily.  No medication changes are indicated today.

## 2023-02-10 ENCOUNTER — Ambulatory Visit: Payer: Medicare HMO | Admitting: Nutrition

## 2023-03-11 ENCOUNTER — Other Ambulatory Visit: Payer: Self-pay | Admitting: Internal Medicine

## 2023-03-11 DIAGNOSIS — F419 Anxiety disorder, unspecified: Secondary | ICD-10-CM

## 2023-04-10 ENCOUNTER — Other Ambulatory Visit: Payer: Self-pay | Admitting: Internal Medicine

## 2023-04-13 ENCOUNTER — Ambulatory Visit: Payer: Self-pay | Admitting: Internal Medicine

## 2023-04-13 NOTE — Telephone Encounter (Signed)
 Copied from CRM [id number]. Topic: Clinical - Medication Question >> Apr 10, 2023 12:59 PM Alfonso ORN wrote: Reason for CRM: Patient have question if she need to make an appointment for the medication refill for the BUPROPION  HCL PO  300 mg , has no refills , patient indicated that she has had an appointment before last one was in November 2024 , and have  another appointment schedule for 05/07/23

## 2023-04-14 ENCOUNTER — Other Ambulatory Visit: Payer: Self-pay | Admitting: Internal Medicine

## 2023-04-14 DIAGNOSIS — F32A Depression, unspecified: Secondary | ICD-10-CM

## 2023-04-17 ENCOUNTER — Other Ambulatory Visit: Payer: Self-pay | Admitting: Internal Medicine

## 2023-04-20 ENCOUNTER — Telehealth (INDEPENDENT_AMBULATORY_CARE_PROVIDER_SITE_OTHER): Payer: Medicare HMO | Admitting: Internal Medicine

## 2023-04-20 ENCOUNTER — Encounter: Payer: Self-pay | Admitting: Internal Medicine

## 2023-04-20 DIAGNOSIS — F32A Depression, unspecified: Secondary | ICD-10-CM | POA: Diagnosis not present

## 2023-04-20 DIAGNOSIS — F419 Anxiety disorder, unspecified: Secondary | ICD-10-CM

## 2023-04-20 MED ORDER — BUPROPION HCL ER (XL) 300 MG PO TB24
300.0000 mg | ORAL_TABLET | Freq: Every day | ORAL | 1 refills | Status: DC
Start: 2023-04-20 — End: 2023-07-27

## 2023-04-20 NOTE — Progress Notes (Signed)
Virtual Visit via Video Note  I connected with Gina Salazar on 04/20/23 at  1:20 PM EST by a video enabled telemedicine application and verified that I am speaking with the correct person using two identifiers.  Patient Location: Home Provider Location: Office/Clinic  I discussed the limitations, risks, security, and privacy concerns of performing an evaluation and management service by video and the availability of in person appointments. I also discussed with the patient that there may be a patient responsible charge related to this service. The patient expressed understanding and agreed to proceed.  Subjective: PCP: Billie Lade, MD  Chief Complaint  Patient presents with   Anxiety    Follow up, patient requesting refills on bupropion    Gina Salazar has been evaluated through video encounter today for follow-up of anxiety and depression.  She is currently prescribed Wellbutrin XL 300 mg daily and lorazepam 1 mg daily as needed for anxiety relief.  Anxiety and depression are well-controlled on this regimen.  She is due for a refill of Wellbutrin, which has not been refilled by me previously since establishing care at our practice.  She does not have any additional to discuss today.  ROS: Per HPI  Current Outpatient Medications:    acetaminophen (TYLENOL) 325 MG tablet, Take 650 mg by mouth every 6 (six) hours as needed., Disp: , Rfl:    albuterol (VENTOLIN HFA) 108 (90 Base) MCG/ACT inhaler, Inhale into the lungs., Disp: , Rfl:    alendronate (FOSAMAX) 70 MG tablet, Take 70 mg by mouth once a week., Disp: , Rfl:    atorvastatin (LIPITOR) 10 MG tablet, Take 10 mg by mouth daily., Disp: , Rfl:    buPROPion (WELLBUTRIN XL) 300 MG 24 hr tablet, Take 1 tablet (300 mg total) by mouth daily., Disp: 90 tablet, Rfl: 1   calcium carbonate (OSCAL) 1500 (600 Ca) MG TABS tablet, Take 600 mg of elemental calcium by mouth daily with breakfast., Disp: , Rfl:    Cholecalciferol (D-3-5) 125  MCG (5000 UT) capsule, Take 5,000 Units by mouth daily., Disp: , Rfl:    fluticasone (FLONASE) 50 MCG/ACT nasal spray, USE 2 SPRAYS IN EACH NOSTRIL ONCE A DAY TO PREVENT NASAL ALLERGIES., Disp: 48 mL, Rfl: 3   ibuprofen (ADVIL) 100 MG tablet, Take 100 mg by mouth every 6 (six) hours as needed for fever., Disp: , Rfl:    LORazepam (ATIVAN) 0.5 MG tablet, TAKE 2 TABLETS (1 MG TOTAL) BY MOUTH 2 (TWO) TIMES DAILY AS NEEDED FOR ANXIETY., Disp: 60 tablet, Rfl: 0   losartan (COZAAR) 100 MG tablet, Take 100 mg by mouth daily., Disp: , Rfl:   Assessment and Plan:  Anxiety and depression Assessment & Plan: Evaluated today through video encounter for follow-up of anxiety and depression.  Mood remains stable and anxiety adequately controlled with Wellbutrin XL 300 mg daily and lorazepam 1 mg daily as needed for anxiety relief.  PDMP reviewed and remains appropriate. -Wellbutrin XL 300 mg daily refilled today -She will return to care for previously scheduled follow-up in May 2025  Follow Up Instructions: Return if symptoms worsen or fail to improve.   I discussed the assessment and treatment plan with the patient. The patient was provided an opportunity to ask questions, and all were answered. The patient agreed with the plan and demonstrated an understanding of the instructions.   The patient was advised to call back or seek an in-person evaluation if the symptoms worsen or if the condition fails to improve  as anticipated.  The above assessment and management plan was discussed with the patient. The patient verbalized understanding of and has agreed to the management plan.   Billie Lade, MD

## 2023-04-20 NOTE — Assessment & Plan Note (Signed)
Evaluated today through video encounter for follow-up of anxiety and depression.  Mood remains stable and anxiety adequately controlled with Wellbutrin XL 300 mg daily and lorazepam 1 mg daily as needed for anxiety relief.  PDMP reviewed and remains appropriate. -Wellbutrin XL 300 mg daily refilled today -She will return to care for previously scheduled follow-up in May 2025

## 2023-05-07 ENCOUNTER — Ambulatory Visit: Payer: Self-pay | Admitting: Family Medicine

## 2023-05-14 ENCOUNTER — Encounter: Payer: Self-pay | Admitting: Internal Medicine

## 2023-05-14 ENCOUNTER — Ambulatory Visit (INDEPENDENT_AMBULATORY_CARE_PROVIDER_SITE_OTHER): Payer: Medicare HMO | Admitting: Internal Medicine

## 2023-05-14 VITALS — BP 138/77 | HR 88 | Ht 62.5 in | Wt 208.2 lb

## 2023-05-14 DIAGNOSIS — L821 Other seborrheic keratosis: Secondary | ICD-10-CM | POA: Diagnosis not present

## 2023-05-14 DIAGNOSIS — L989 Disorder of the skin and subcutaneous tissue, unspecified: Secondary | ICD-10-CM

## 2023-05-14 DIAGNOSIS — Z1283 Encounter for screening for malignant neoplasm of skin: Secondary | ICD-10-CM | POA: Diagnosis not present

## 2023-05-14 DIAGNOSIS — M7061 Trochanteric bursitis, right hip: Secondary | ICD-10-CM | POA: Insufficient documentation

## 2023-05-14 NOTE — Assessment & Plan Note (Signed)
She is concerned about skin lesions on her left upper back and left upper arm.  On exam these appear most consistent with seborrheic keratoses.  We discussed that these are benign lesions.  Given multiple dermatologic concerns, dermatology referral placed today.

## 2023-05-14 NOTE — Patient Instructions (Signed)
It was a pleasure to see you today.  Thank you for giving [place] the opportunity to be involved in your care.  Below is a brief recap of your visit and next steps.  We will plan to see you again in May.  Summary I believe your hip pain is due to greater trochanteric bursitis. Please use voltaren gel as discussed and see the exercises for your hip. The lesions on your back and arm appear to be seborrheic keratoses. These are benign lesions. I will also place a dermatology referral today. Follow up as scheduled in May.

## 2023-05-14 NOTE — Assessment & Plan Note (Signed)
Presenting today for an acute visit endorsing a several week history of right lateral hip pain.  No inciting event or trauma at the onset of pain.  There is tenderness palpation over the greater trochanter.  Presenting symptoms and exam findings seem most consistent with greater trochanteric bursitis.  Treatment options reviewed.  I recommended routine use of Voltaren gel for pain relief.  Home PT exercises provided.  She will return to care if symptoms worsen or fail to improve at which time we discussed pursuing corticosteroid injection.

## 2023-05-14 NOTE — Progress Notes (Signed)
   Acute Office Visit  Subjective:     Patient ID: Gina Salazar, female    DOB: 12-25-1940, 83 y.o.   MRN: [id number]  Chief Complaint  Patient presents with   Hip Pain    Right hip and leg pain    spot     Spot on back that is very itchy    Hair/Scalp Problem    Itchy scalp    Gina Salazar presents for an acute visit with multiple concerns.  Her initial concern of right lateral hip pain has been present for multiple weeks.  There was no inciting event or trauma at the onset of pain.  Pain is located over the lateral hip and radiates distally to the lateral aspect of the right knee.  Symptoms are worse with standing and walking.  She has a hip of right hip arthroplasty several years ago.  She has tried taking Advil for pain relief, which is modestly effective.  Her additional concern today are his skin lesions on her back and left upper arm.  She would like to have them evaluated.  She also endorses itching along her scalp.  Review of Systems  Musculoskeletal:  Positive for joint pain (Right lateral hip pain).  Skin:  Positive for itching.       Skin lesion on left upper back and left upper arm  All other systems reviewed and are negative.     Objective:    BP 138/77 (BP Location: Left Arm, Patient Position: Sitting, Cuff Size: Large)   Pulse 88   Ht 5' 2.5" (1.588 m)   Wt 208 lb 3.2 oz (94.4 kg)   SpO2 96%   BMI 37.47 kg/m   Physical Exam Vitals reviewed.  Constitutional:      Appearance: Normal appearance. She is obese.  Musculoskeletal:        General: Normal range of motion.     Comments: No obvious deformity on inspection of the right hip.  Active and passive ROM are intact.  There is tenderness to palpation over the right greater trochanter.  No significant pain is elicited with resisted abduction of the right leg.  Skin:    Findings: Lesion (SK noted on the left upper back and left upper arm) present.       Assessment & Plan:   Problem List Items Addressed  This Visit       Greater trochanteric bursitis of right hip - Primary   Presenting today for an acute visit endorsing a several week history of right lateral hip pain.  No inciting event or trauma at the onset of pain.  There is tenderness palpation over the greater trochanter.  Presenting symptoms and exam findings seem most consistent with greater trochanteric bursitis.  Treatment options reviewed.  I recommended routine use of Voltaren gel for pain relief.  Home PT exercises provided.  She will return to care if symptoms worsen or fail to improve at which time we discussed pursuing corticosteroid injection.      Seborrheic keratoses   She is concerned about skin lesions on her left upper back and left upper arm.  On exam these appear most consistent with seborrheic keratoses.  We discussed that these are benign lesions.  Given multiple dermatologic concerns, dermatology referral placed today.      Return if symptoms worsen or fail to improve.  Billie Lade, MD

## 2023-05-30 ENCOUNTER — Other Ambulatory Visit: Payer: Self-pay | Admitting: Internal Medicine

## 2023-05-30 DIAGNOSIS — F419 Anxiety disorder, unspecified: Secondary | ICD-10-CM

## 2023-06-24 ENCOUNTER — Encounter: Payer: Self-pay | Admitting: Internal Medicine

## 2023-06-29 ENCOUNTER — Telehealth: Payer: Self-pay | Admitting: Internal Medicine

## 2023-06-29 ENCOUNTER — Other Ambulatory Visit: Payer: Self-pay

## 2023-06-29 DIAGNOSIS — M25551 Pain in right hip: Secondary | ICD-10-CM

## 2023-06-29 NOTE — Telephone Encounter (Signed)
 Copied from CRM [id number]. Topic: Referral - Question >> Jun 29, 2023 10:17 AM Izetta Dakin wrote: Reason for CRM: Patient states that during her previous appointment with provider, she was told that she can be referred out for an injection for her hip pain. Patient is requesting additional information and an appointment for the injection. Please contact patient with additional instructions. Callback # [phone]

## 2023-06-29 NOTE — Telephone Encounter (Signed)
 Referral placed.

## 2023-07-02 ENCOUNTER — Encounter: Payer: Self-pay | Admitting: Internal Medicine

## 2023-07-03 MED ORDER — HYDROCORTISONE ACETATE 25 MG RE SUPP: 25.0000 mg | Freq: Two times a day (BID) | RECTAL | 0 refills | Status: DC

## 2023-07-10 ENCOUNTER — Other Ambulatory Visit: Payer: Self-pay | Admitting: Internal Medicine

## 2023-07-10 MED ORDER — LOSARTAN POTASSIUM 100 MG PO TABS: 100.0000 mg | ORAL_TABLET | Freq: Every day | ORAL | 0 refills | Status: DC

## 2023-07-10 NOTE — Telephone Encounter (Signed)
 Copied from CRM [id number]. Topic: Clinical - Medication Refill >> Jul 10, 2023  2:52 PM Clayton Bibles wrote: Most Recent Primary Care Visit:  Provider: Christel Mormon E  Department: [facility]  Visit Type: ACUTE  Date: 05/14/2023  Medication: losartan (COZAAR) 100 MG tablet  Has the patient contacted their pharmacy? Yes (Agent: If no, request that the patient contact the pharmacy for the refill. If patient does not wish to contact the pharmacy document the reason why and proceed with request.) (Agent: If yes, when and what did the pharmacy advise?) Pharmacy said that she is refilling this medication too earlier. She would be able to refill in July, 2025. She thinks she has misplaced or did not pick up. Pharmacy needs an order to refill early  Is this the correct pharmacy for this prescription? Yes If no, delete pharmacy and type the correct one.  This is the patient's preferred pharmacy:  [id number] - [place], [place] - [address] AT [facility] [address] [place] [address] Phone: [phone] Fax: [phone]   Has the prescription been filled recently? No  Is the patient out of the medication? No - She has 7 tablets left.   Has the patient been seen for an appointment in the last year OR does the patient have an upcoming appointment? Yes  Can we respond through MyChart? Yes - Please send message.   Agent: Please be advised that Rx refills may take up to 3 business days. We ask that you follow-up with your pharmacy.

## 2023-07-17 ENCOUNTER — Other Ambulatory Visit: Payer: Self-pay | Admitting: Internal Medicine

## 2023-07-17 MED ORDER — ALBUTEROL SULFATE HFA 108 (90 BASE) MCG/ACT IN AERS: 1.0000 | INHALATION_SPRAY | Freq: Four times a day (QID) | RESPIRATORY_TRACT | 2 refills | Status: DC | PRN

## 2023-07-17 NOTE — Telephone Encounter (Signed)
 Copied from CRM [id number]. Topic: Clinical - Medication Refill >> Jul 17, 2023 12:56 PM DeAngela L wrote: Most Recent Primary Care Visit:  Provider: DIXON, PHILLIP E  Department: [facility]  Visit Type: ACUTE  Date: 05/14/2023  Medication: albuterol  (VENTOLIN  HFA) 108 (90 Base) MCG/ACT inhaler   Has the patient contacted their pharmacy? Yes  (Agent: If no, request that the patient contact the pharmacy for the refill. If patient does not wish to contact the pharmacy document the reason why and proceed with request.) (Agent: If yes, when and what did the pharmacy advise?)  Is this the correct pharmacy for this prescription? Yes  If no, delete pharmacy and type the correct one.  This is the patient's preferred pharmacy:  [id number] - [place], [place] - [address] AT [facility] [address] [place] [address] Phone: [phone] Fax: [phone]   Has the prescription been filled recently? Yes  Is the patient out of the medication? Yes  Has the patient been seen for an appointment in the last year OR does the patient have an upcoming appointment? Yes  Can we respond through MyChart? Yes  Agent: Please be advised that Rx refills may take up to 3 business days. We ask that you follow-up with your pharmacy.

## 2023-07-25 ENCOUNTER — Other Ambulatory Visit: Payer: Self-pay | Admitting: Internal Medicine

## 2023-07-25 DIAGNOSIS — F32A Depression, unspecified: Secondary | ICD-10-CM

## 2023-08-04 ENCOUNTER — Encounter: Payer: Self-pay | Admitting: Internal Medicine

## 2023-08-04 ENCOUNTER — Ambulatory Visit: Payer: Medicare HMO | Admitting: Internal Medicine

## 2023-08-04 VITALS — BP 138/83 | HR 86 | Ht 63.0 in | Wt 205.6 lb

## 2023-08-04 DIAGNOSIS — F32A Depression, unspecified: Secondary | ICD-10-CM | POA: Diagnosis not present

## 2023-08-04 DIAGNOSIS — R7303 Prediabetes: Secondary | ICD-10-CM

## 2023-08-04 DIAGNOSIS — F419 Anxiety disorder, unspecified: Secondary | ICD-10-CM | POA: Diagnosis not present

## 2023-08-04 DIAGNOSIS — E782 Mixed hyperlipidemia: Secondary | ICD-10-CM

## 2023-08-04 DIAGNOSIS — I10 Essential (primary) hypertension: Secondary | ICD-10-CM

## 2023-08-04 DIAGNOSIS — M810 Age-related osteoporosis without current pathological fracture: Secondary | ICD-10-CM

## 2023-08-04 DIAGNOSIS — E669 Obesity, unspecified: Secondary | ICD-10-CM | POA: Diagnosis not present

## 2023-08-04 DIAGNOSIS — N3945 Continuous leakage: Secondary | ICD-10-CM | POA: Diagnosis not present

## 2023-08-04 NOTE — Patient Instructions (Signed)
 It was a pleasure to see you today.  Thank you for giving us  the opportunity to be involved in your care.  Below is a brief recap of your visit and next steps.  We will plan to see you again in 6 months.  Summary No medication changes today Urology referral placed Repeat labs ordered See contact information for dermatology below Follow up in 6 months  Dr. [facility]: [phone]

## 2023-08-04 NOTE — Progress Notes (Signed)
 Established Patient Office Visit  Subjective   Patient ID: Gina Salazar, female    DOB: [birth year]  Age: 83 y.o. MRN: [id number]  Chief Complaint  Patient presents with   Care Management    Six month follow up    Gina Salazar returns today for routine follow-up.  She was last evaluated by me on 2/13 for an acute visit endorsing a several week history of right lateral hip pain.  Treated for greater trochanteric bursitis of the right hip.  She was also referred to dermatology for evaluation in the setting of multiple dermatologic concerns.  Previously seen by me for routine follow-up in November 2024.  No medication changes were made at that time and 52-month follow-up was arranged.  There have been no acute interval events. Gina Salazar reports feeling fairly well today.  Her acute concern is urinary incontinence.  She describes a continuous "leakage" of urine and would like to see urology.  She does not have any additional concerns to discuss.  Past Medical History:  Diagnosis Date   Depression    High cholesterol    Hypertension    Past Surgical History:  Procedure Laterality Date   CHOLECYSTECTOMY     TONSILLECTOMY     TOTAL HIP ARTHROPLASTY Right    Social History   Tobacco Use   Smoking status: Former    Types: Cigarettes   Smokeless tobacco: Never  Vaping Use   Vaping status: Never Used  Substance Use Topics   Alcohol use: Never   Drug use: Never   Family History  Problem Relation Age of Onset   Breast cancer Maternal Aunt    Breast cancer Maternal Uncle    No Known Allergies  Review of Systems  Constitutional:  Negative for chills and fever.  HENT:  Negative for sore throat.   Respiratory:  Negative for cough and shortness of breath.   Cardiovascular:  Negative for chest pain, palpitations and leg swelling.  Gastrointestinal:  Negative for abdominal pain, blood in stool, constipation, diarrhea, nausea and vomiting.  Genitourinary:  Negative for dysuria and  hematuria.       Urinary incontinence  Musculoskeletal:  Positive for joint pain (R lateral hip pain). Negative for myalgias.  Skin:  Negative for itching and rash.  Neurological:  Negative for dizziness and headaches.  Psychiatric/Behavioral:  Negative for depression and suicidal ideas.      Objective:     BP 138/83   Pulse 86   Ht 5\' 3"  (1.6 m)   Wt 205 lb 9.6 oz (93.3 kg)   SpO2 97%   BMI 36.42 kg/m  BP Readings from Last 3 Encounters:  08/12/23 (!) 151/74  08/04/23 138/83  05/14/23 138/77   Physical Exam Vitals reviewed.  Constitutional:      General: She is not in acute distress.    Appearance: Normal appearance. She is obese. She is not toxic-appearing.  HENT:     Head: Normocephalic and atraumatic.     Right Ear: External ear normal.     Left Ear: External ear normal.     Nose: Nose normal. No congestion or rhinorrhea.     Mouth/Throat:     Mouth: Mucous membranes are moist.     Pharynx: Oropharynx is clear. No oropharyngeal exudate or posterior oropharyngeal erythema.  Eyes:     General: No scleral icterus.    Extraocular Movements: Extraocular movements intact.     Conjunctiva/sclera: Conjunctivae normal.     Pupils: Pupils are equal, round,  and reactive to light.  Cardiovascular:     Rate and Rhythm: Normal rate and regular rhythm.     Pulses: Normal pulses.     Heart sounds: Normal heart sounds. No murmur heard.    No friction rub. No gallop.  Pulmonary:     Effort: Pulmonary effort is normal.     Breath sounds: Normal breath sounds. No wheezing, rhonchi or rales.  Abdominal:     General: Abdomen is flat. Bowel sounds are normal. There is no distension.     Palpations: Abdomen is soft.     Tenderness: There is no abdominal tenderness.  Musculoskeletal:        General: No swelling. Normal range of motion.     Cervical back: Normal range of motion.     Right lower leg: No edema.     Left lower leg: No edema.  Lymphadenopathy:     Cervical: No  cervical adenopathy.  Skin:    General: Skin is warm and dry.     Capillary Refill: Capillary refill takes less than 2 seconds.     Coloration: Skin is not jaundiced.  Neurological:     General: No focal deficit present.     Mental Status: She is alert and oriented to person, place, and time.     Gait: Gait abnormal (ambulates with a cane).  Psychiatric:        Mood and Affect: Mood normal.        Behavior: Behavior normal.   Last CBC Lab Results  Component Value Date   WBC 7.5 08/04/2023   HGB 13.7 08/04/2023   HCT 41.8 08/04/2023   MCV 98 (H) 08/04/2023   MCH 32.2 08/04/2023   RDW 11.6 (L) 08/04/2023   PLT 308 08/04/2023   Last metabolic panel Lab Results  Component Value Date   GLUCOSE 101 (H) 08/04/2023   NA 141 08/04/2023   K 4.6 08/04/2023   CL 101 08/04/2023   CO2 22 08/04/2023   BUN 15 08/04/2023   CREATININE 0.88 08/04/2023   EGFR 66 08/04/2023   CALCIUM 10.4 (H) 08/04/2023   PROT 6.4 08/04/2023   ALBUMIN 4.2 08/04/2023   LABGLOB 2.2 08/04/2023   AGRATIO 2.3 (H) 08/01/2022   BILITOT 0.6 08/04/2023   ALKPHOS 68 08/04/2023   AST 17 08/04/2023   ALT 14 08/04/2023   ANIONGAP 7 09/14/2020   Last lipids Lab Results  Component Value Date   CHOL 188 08/04/2023   HDL 83 08/04/2023   LDLCALC 85 08/04/2023   TRIG 114 08/04/2023   CHOLHDL 2.3 08/04/2023   Last hemoglobin A1c Lab Results  Component Value Date   HGBA1C 5.5 08/04/2023   Last thyroid functions Lab Results  Component Value Date   TSH 1.750 08/04/2023   Last vitamin D  Lab Results  Component Value Date   VD25OH 49.4 08/04/2023   Last vitamin B12 and Folate Lab Results  Component Value Date   VITAMINB12 1,528 (H) 08/04/2023   FOLATE 17.3 08/04/2023     Assessment & Plan:   Problem List Items Addressed This Visit       Essential hypertension   Remains adequately controlled on current antihypertensive regimen.      Osteoporosis   Known history of osteoporosis.  She remains on  Fosamax 70 mg weekly as well as vitamin D  + calcium supplementation.      Anxiety and depression   Mood remains stable and anxiety adequately controlled on current regimen of Wellbutrin  XL 300 mg  daily and lorazepam  1 mg as needed for anxiety relief.  PDMP reviewed and remains appropriate.      Hyperlipidemia   Lipid panel last updated in May 2024.  Total cholesterol 178 and LDL 87.  She is currently prescribed atorvastatin 10 mg daily.  Repeat lipid panel ordered today.      Prediabetes   A1c 5.8 on labs from May 2024.  She has focused on dietary changes in an effort to lose weight and lower her blood sugar in light of this result.  Repeat A1c ordered today.      Urinary incontinence - Primary   Her acute concern today is urinary incontinence.  She describes a continuous "leakage" of urine.  Urology referral placed.      Return in about 6 months (around 02/04/2024).   Tobi Fortes, MD

## 2023-08-05 ENCOUNTER — Encounter: Payer: Self-pay | Admitting: Internal Medicine

## 2023-08-05 LAB — CBC WITH DIFFERENTIAL/PLATELET
Basophils Absolute: 0.1 10*3/uL (ref 0.0–0.2)
Basos: 1 %
EOS (ABSOLUTE): 0.3 10*3/uL (ref 0.0–0.4)
Eos: 3 %
Hematocrit: 41.8 % (ref 34.0–46.6)
Hemoglobin: 13.7 g/dL (ref 11.1–15.9)
Immature Grans (Abs): 0 10*3/uL (ref 0.0–0.1)
Immature Granulocytes: 0 %
Lymphocytes Absolute: 1.7 10*3/uL (ref 0.7–3.1)
Lymphs: 22 %
MCH: 32.2 pg (ref 26.6–33.0)
MCHC: 32.8 g/dL (ref 31.5–35.7)
MCV: 98 fL — ABNORMAL HIGH (ref 79–97)
Monocytes Absolute: 0.8 10*3/uL (ref 0.1–0.9)
Monocytes: 10 %
Neutrophils Absolute: 4.8 10*3/uL (ref 1.4–7.0)
Neutrophils: 64 %
Platelets: 308 10*3/uL (ref 150–450)
RBC: 4.25 x10E6/uL (ref 3.77–5.28)
RDW: 11.6 % — ABNORMAL LOW (ref 11.7–15.4)
WBC: 7.5 10*3/uL (ref 3.4–10.8)

## 2023-08-05 LAB — CMP14+EGFR
ALT: 14 IU/L (ref 0–32)
AST: 17 IU/L (ref 0–40)
Albumin: 4.2 g/dL (ref 3.8–4.8)
Alkaline Phosphatase: 68 IU/L (ref 44–121)
BUN/Creatinine Ratio: 17 (ref 12–28)
BUN: 15 mg/dL (ref 8–27)
Bilirubin Total: 0.6 mg/dL (ref 0.0–1.2)
CO2: 22 mmol/L (ref 20–29)
Calcium: 10.4 mg/dL — ABNORMAL HIGH (ref 8.7–10.3)
Chloride: 101 mmol/L (ref 96–106)
Creatinine, Ser: 0.88 mg/dL (ref 0.57–1.00)
Globulin, Total: 2.2 g/dL (ref 1.5–4.5)
Glucose: 101 mg/dL — ABNORMAL HIGH (ref 70–99)
Potassium: 4.6 mmol/L (ref 3.5–5.2)
Sodium: 141 mmol/L (ref 134–144)
Total Protein: 6.4 g/dL (ref 6.0–8.5)
eGFR: 66 mL/min/{1.73_m2} (ref >59)

## 2023-08-05 LAB — LIPID PANEL
Chol/HDL Ratio: 2.3 ratio (ref 0.0–4.4)
Cholesterol, Total: 188 mg/dL (ref 100–199)
HDL: 83 mg/dL (ref >39)
LDL Chol Calc (NIH): 85 mg/dL (ref 0–99)
Triglycerides: 114 mg/dL (ref 0–149)
VLDL Cholesterol Cal: 20 mg/dL (ref 5–40)

## 2023-08-05 LAB — TSH+FREE T4
Free T4: 1.26 ng/dL (ref 0.82–1.77)
TSH: 1.75 u[IU]/mL (ref 0.450–4.500)

## 2023-08-05 LAB — B12 AND FOLATE PANEL
Folate: 17.3 ng/mL (ref >3.0)
Vitamin B-12: 1528 pg/mL — ABNORMAL HIGH (ref 232–1245)

## 2023-08-05 LAB — HEMOGLOBIN A1C
Est. average glucose Bld gHb Est-mCnc: 111 mg/dL
Hgb A1c MFr Bld: 5.5 % (ref 4.8–5.6)

## 2023-08-05 LAB — VITAMIN D 25 HYDROXY (VIT D DEFICIENCY, FRACTURES): Vit D, 25-Hydroxy: 49.4 ng/mL (ref 30.0–100.0)

## 2023-08-07 ENCOUNTER — Other Ambulatory Visit: Payer: Self-pay | Admitting: Internal Medicine

## 2023-08-07 DIAGNOSIS — F32A Depression, unspecified: Secondary | ICD-10-CM

## 2023-08-12 ENCOUNTER — Encounter: Payer: Self-pay | Admitting: Orthopedic Surgery

## 2023-08-12 ENCOUNTER — Ambulatory Visit: Admitting: Orthopedic Surgery

## 2023-08-12 VITALS — BP 151/74 | HR 80 | Ht 63.0 in | Wt 203.0 lb

## 2023-08-12 DIAGNOSIS — M5410 Radiculopathy, site unspecified: Secondary | ICD-10-CM

## 2023-08-12 DIAGNOSIS — M51362 Other intervertebral disc degeneration, lumbar region with discogenic back pain and lower extremity pain: Secondary | ICD-10-CM | POA: Diagnosis not present

## 2023-08-12 DIAGNOSIS — M545 Low back pain, unspecified: Secondary | ICD-10-CM | POA: Diagnosis not present

## 2023-08-12 NOTE — Progress Notes (Signed)
  Intake history:  BP (!) 151/74   Pulse 80   Ht 5\' 3"  (1.6 m)   Wt 203 lb (92.1 kg)   BMI 35.96 kg/m  Body mass index is 35.96 kg/m.    WHAT ARE WE SEEING YOU FOR TODAY?   right hip(s)/ back down leg   How long has this bothered you? (DOI?DOS?WS?)  on after hip replacement    Anticoag.  No  Diabetes No  Heart disease No  Hypertension Yes  SMOKING HX No  Kidney disease No  Any ALLERGIES ______________________________________________   Treatment:  Have you taken:  Tylenol Yes  Advil Yes  Had PT Yes/ after hip replaced, not for back   Had injection   Other  _________________________

## 2023-08-12 NOTE — Patient Instructions (Signed)
 Tylenol 500 mg every 6 hrs as needed   Ibuprofen 800 mg every 8 hrs as needed

## 2023-08-12 NOTE — Progress Notes (Signed)
 Chief Complaint  Patient presents with   Hip Pain    Starting in the lateral hip going down the leg into the foot. 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  She said the surgery went well.  She is here today to evaluate painful right leg.  The pain is located in the buttocks radiates across the hip down the leg and into the foot and is worse upon standing  No treatment to date.  She has a history of spinal injections for lower back pain she has not had therapy for back problems in the past  She does get some relief from ibuprofen  BP (!) 151/74   Pulse 80   Ht 5\' 3"  (1.6 m)   Wt 203 lb (92.1 kg)   BMI 35.96 kg/m   Physical exam findings  General appearance normal  Mood and affect normal  She is oriented to person place and time  She does walk independently  She has normal range of motion in her right hip and normal leg lengths  I see no peripheral edema  She is tender in her lower back right buttock lateral thigh down into the lower part of the leg  Skin incision from the hip replacement is normal  Imaging studies show degenerative disc disease in the lumbar spine I have seen that film and that is my interpretation of it she has a slight curve in the back but does not reach the level of scoliosis as it is less than 10 degrees  Her right total hip arthroplasty has a screw in the cup and is normal in terms of alignment and there are no signs of loosening  Assessment and plan  Encounter Diagnoses  Name Primary?   Lumbar pain    Radicular pain of right lower extremity    Degeneration of intervertebral disc of lumbar region with discogenic back pain and lower extremity pain Yes    Her pain is radicular in nature she has elements of spinal stenosis and degenerative disc disease  Recommend Tylenol, ibuprofen and physical  therapy  No follow-up required

## 2023-08-18 NOTE — Therapy (Signed)
 OUTPATIENT PHYSICAL THERAPY THORACOLUMBAR EVALUATION   Patient Name: Gina Salazar MRN: [id number] DOB:11/22/1940, 83 y.o., female Today's Date: 08/19/2023  END OF SESSION:  PT End of Session - 08/19/23 1200     Visit Number 1    Number of Visits 12    Date for PT Re-Evaluation 09/16/23    Authorization Type [employer] HMO/PPO    Authorization Time Period No auth    PT Start Time 1104    PT Stop Time 1150    PT Time Calculation (min) 46 min    Activity Tolerance Patient tolerated treatment well    Behavior During Therapy WFL for tasks assessed/performed             Past Medical History:  Diagnosis Date   Depression    High cholesterol    Hypertension    Past Surgical History:  Procedure Laterality Date   CHOLECYSTECTOMY     TONSILLECTOMY     TOTAL HIP ARTHROPLASTY Right    Patient Active Problem List   Diagnosis Date Noted   Greater trochanteric bursitis of right hip 05/14/2023   Seborrheic keratoses 05/14/2023   Prediabetes 02/02/2023   Need for influenza vaccination 02/02/2023   Pain of left calf 09/02/2022   Anxiety and depression 08/01/2022   Essential hypertension 08/01/2022   Osteoporosis 08/01/2022   Hyperlipidemia 08/01/2022   Asthma due to seasonal allergies 08/01/2022   Obesity (BMI 30-39.9) 08/01/2022   Encounter for general adult medical examination with abnormal findings 08/01/2022   Skin cancer screening 08/01/2022   Need for pneumococcal 20-valent conjugate vaccination 08/01/2022    PCP: Tobi Fortes, MD  REFERRING PROVIDER: Darrin Emerald, MD  REFERRING DIAG: M54.50 (ICD-10-CM) - Lumbar pain M54.10 (ICD-10-CM) - Radicular pain of right lower extremity  Rationale for Evaluation and Treatment: Rehabilitation  THERAPY DIAG:  Other low back pain  Radicular pain of right lower extremity  Impaired functional mobility, balance, gait, and endurance  ONSET DATE: Chronic LBP over past year, RLE more recent within last few  weeks   SUBJECTIVE:                                                                                                                                                                                           SUBJECTIVE STATEMENT: Patient reported she's had back pain for a year or so but RLE pain has occurred more recently. Low back pain feels like an ache. RLE pain just hurts, reports "feels like a line of pain pulling down leg". Primary MD thought it to be bursa but orthopedic MD said it was nerve related so they sent her here.  Reports standing, and walking as most times when she has her pain. Cooking is irritating. Reports she can stand for about 15 minutes before having to sit at least 5 minutes and then can start moving again. No issue with prolonged sitting or sleeping   PERTINENT HISTORY:  R THA Torn meniscal repair of R knee   PAIN:  Are you having pain? Yes: NPRS scale: 2 in sitting, 6-7/10 w/ STS and walking Pain location: mid Low back and RLE Pain description: Ache and nerve pain Aggravating factors: standing, walking, or anything when she's up on her feet Relieving factors: Rest, extra strength tylenol  PRECAUTIONS: None  RED FLAGS: None   WEIGHT BEARING RESTRICTIONS: No  FALLS:  Has patient fallen in last 6 months? No  LIVING ENVIRONMENT: Lives with: lives with their family Stairs: Yes: External: 1 steps; on right going up Has following equipment at home: Single point cane  OCCUPATION: Retired, Investment banker, corporate   PLOF: Independent  PATIENT GOALS: "Would like better balance, and less pain and be able to go to [employer]  NEXT MD VISIT: no scheduled appointment besides annual visit  OBJECTIVE:  Note: Objective measures were completed at Evaluation unless otherwise noted.  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   No acute abnormalities.  PATIENT SURVEYS:  Modified Oswestry Modified  Oswestry Low Back Pain Disability Questionnaire: 17 / 50 = 34.0 %   COGNITION: Overall cognitive status: Within functional limits for tasks assessed     SENSATION: WFL   POSTURE: rounded shoulders and forward head  PALPATION: Inc discomfort in low back with CPA at L2 level Tenderness w/ palpation to R piriformis area  LUMBAR ROM:   AROM eval  Flexion 75% avail  Extension 50% avail  * in low back   Right lateral flexion To knee joint, * in low back  Left lateral flexion To knee joint  Right rotation 75% avail  Left rotation 75% avail   (Blank rows = not tested)   *= painful  *Repetitive lumbar flexion 5 reps unremarkable *Repetitive lumbar extension 5 reps w/ increased discomfort to low back only  LOWER EXTREMITY ROM:     Active  Right eval Left eval  Hip flexion    Hip extension    Hip abduction    Hip adduction    Hip internal rotation    Hip external rotation    Knee flexion    Knee extension    Ankle dorsiflexion    Ankle plantarflexion    Ankle inversion    Ankle eversion     (Blank rows = not tested)  LOWER EXTREMITY MMT:    MMT Right eval Left eval  Hip flexion 4- 4  Hip extension 4- 4  Hip abduction 4- 4  Hip adduction    Hip internal rotation    Hip external rotation    Knee flexion 4 4+  Knee extension 4 4+  Ankle dorsiflexion 5 5  Ankle plantarflexion    Ankle inversion    Ankle eversion     (Blank rows = not tested)  LUMBAR SPECIAL TESTS:  Straight leg raise test: Negative and Quadrant test: Positive, pain in low back  FUNCTIONAL TESTS:  2 minute walk test: 300', pain in back 30" into test, pain in RLE 1 min into test, feels like a burning pain in LE 30 sec chair stand: 11 STS, pain at R hip at 4th STS GAIT: Distance walked: 300' Assistive device utilized:  Single point cane Level of assistance: Modified independence Comments: Antalgic-like gait, dec velocity, Dec step length bilaterally but more apparent on R, dec hip ext  bilaterally, dec stance on R  TREATMENT DATE:  08/19/23: PT Eval and HEP                                                                                                                                 PATIENT EDUCATION:  Education details: PT evaluation, objective findings, POC, Importance of HEP, Precautions, Clinic policies  Person educated: Patient Education method: Explanation and Demonstration Education comprehension: verbalized understanding and returned demonstration  HOME EXERCISE PROGRAM: Access Code: [email or web address] URL: [email or web address] Date: 08/19/2023 Prepared by: Virgia Griffins Powell-Butler  Exercises - Supine Lower Trunk Rotation  - 2 x daily - 7 x weekly - 2 sets - 10 reps - Hooklying Single Knee to Chest Stretch  - 2 x daily - 7 x weekly - 2 sets - 10 reps - Pelvic Tilt  - 2 x daily - 7 x weekly - 2 sets - 10 reps - 5 hold - Supine Transversus Abdominis Bracing - Hands on Stomach  - 2 x daily - 7 x weekly - 2 sets - 10 reps - 5 hold  ASSESSMENT:  CLINICAL IMPRESSION: Patient is a 83 y.o. female who was seen today for physical therapy evaluation and treatment for M54.50 (ICD-10-CM) - Lumbar pain M54.10 (ICD-10-CM) - Radicular pain of right lower extremity. Patient reports to PT eval with reports of increased low back pain and radicular symptoms in RLE. Familiar low back pain is reproduced with extension, R lateral flexion, and quadrant testing. Unable to reproduced familiar RLE pain during session but patient reports pain is usually produced during functional tasks that require lumbar extension such as prolonged standing, and walking. Patient's provoked low back pain is reduced in session with flexion based exercises. Patient also demonstrates decreased LE strength, R>L, decreased endurance, and altered gait, which all are hindering her function. Patient will benefit from skilled physical therapy in order to address the above to improve function and QOL.      OBJECTIVE IMPAIRMENTS: Abnormal gait, decreased activity tolerance, decreased endurance, decreased mobility, difficulty walking, decreased ROM, decreased strength, improper body mechanics, postural dysfunction, and pain.   ACTIVITY LIMITATIONS: carrying, lifting, bending, standing, squatting, stairs, transfers, and bed mobility  PARTICIPATION LIMITATIONS: meal prep, cleaning, laundry, community activity, and yard work  PERSONAL FACTORS: N/A are also affecting patient's functional outcome.   REHAB POTENTIAL: Good  CLINICAL DECISION MAKING: Stable/uncomplicated  EVALUATION COMPLEXITY: Low   GOALS: Goals reviewed with patient? No  SHORT TERM GOALS: Target date: 09/02/23 Patient will be independent with performance of HEP to demonstrate adequate self management of symptoms.  Baseline:  Goal status: INITIAL  2.   Patient will report at least a 25% improvement with function or pain overall since beginning PT. Baseline:  Goal status: INITIAL   LONG TERM GOALS: Target date: 09/30/23 Patient  will improve Oswestry score by  10  points in order to improve self-perceived disability and overall function.  Baseline: Goal status: INITIAL   2.  Patient will report 3/10 pain or less with RLE daily pain in order to improve overall daily function.  Baseline: Goal status: INITIAL   3.  Patient will improve  30 sec chair stand  test to  at least 14 STS in order to improve LE strength and endurance to improve gait . Baseline:  Goal status: INITIAL   4.  Patient will demonstrate at least 75% avail lumbar ext with reports of 2/10 pain or less to be able to perform overhead functional tasks with ease. Baseline:  Goal status: INITIAL   5.  Patient will report overall 50% improvement since beginning PT. Baseline:  Goal status: INITIAL   PLAN:  PT FREQUENCY: 1-2x/week  PT DURATION: 6 weeks  PLANNED INTERVENTIONS: 97164- PT Re-evaluation, 97110-Therapeutic exercises, 97530- Therapeutic  activity, [id number]- Neuromuscular re-education, 97535- Self Care, [id number]- Manual therapy, [id number]- Gait training, [phone]- Electrical stimulation (manual), [phone]- Traction (mechanical), Patient/Family education, Balance training, Stair training, Dry Needling, Joint mobilization, Spinal mobilization, and Moist heat.  PLAN FOR NEXT SESSION: Assess balance, slowly re-introduce extension with pt tolerance, core strengthening, RLE strengthening    12:33 PM, 08/19/23 Earlyn Sylvan Powell-Butler, PT, DPT [facility] [place]

## 2023-08-19 ENCOUNTER — Ambulatory Visit (HOSPITAL_COMMUNITY): Attending: Orthopedic Surgery

## 2023-08-19 ENCOUNTER — Encounter (HOSPITAL_COMMUNITY): Payer: Self-pay

## 2023-08-19 ENCOUNTER — Other Ambulatory Visit: Payer: Self-pay

## 2023-08-19 DIAGNOSIS — M5410 Radiculopathy, site unspecified: Secondary | ICD-10-CM | POA: Insufficient documentation

## 2023-08-19 DIAGNOSIS — Z7409 Other reduced mobility: Secondary | ICD-10-CM | POA: Insufficient documentation

## 2023-08-19 DIAGNOSIS — M545 Low back pain, unspecified: Secondary | ICD-10-CM | POA: Diagnosis not present

## 2023-08-19 DIAGNOSIS — M5459 Other low back pain: Secondary | ICD-10-CM | POA: Diagnosis not present

## 2023-08-25 ENCOUNTER — Encounter (HOSPITAL_COMMUNITY): Admitting: Physical Therapy

## 2023-09-01 ENCOUNTER — Ambulatory Visit (HOSPITAL_COMMUNITY): Attending: Orthopedic Surgery | Admitting: Physical Therapy

## 2023-09-01 DIAGNOSIS — M5459 Other low back pain: Secondary | ICD-10-CM | POA: Insufficient documentation

## 2023-09-01 DIAGNOSIS — R262 Difficulty in walking, not elsewhere classified: Secondary | ICD-10-CM | POA: Diagnosis not present

## 2023-09-01 DIAGNOSIS — R2689 Other abnormalities of gait and mobility: Secondary | ICD-10-CM | POA: Insufficient documentation

## 2023-09-01 DIAGNOSIS — Z7409 Other reduced mobility: Secondary | ICD-10-CM | POA: Diagnosis not present

## 2023-09-01 DIAGNOSIS — R32 Unspecified urinary incontinence: Secondary | ICD-10-CM | POA: Insufficient documentation

## 2023-09-01 DIAGNOSIS — M5410 Radiculopathy, site unspecified: Secondary | ICD-10-CM | POA: Diagnosis not present

## 2023-09-01 NOTE — Assessment & Plan Note (Signed)
 Her acute concern today is urinary incontinence.  She describes a continuous "leakage" of urine.  Urology referral placed.

## 2023-09-01 NOTE — Assessment & Plan Note (Signed)
 Mood remains stable and anxiety adequately controlled on current regimen of Wellbutrin  XL 300 mg daily and lorazepam  1 mg as needed for anxiety relief.  PDMP reviewed and remains appropriate.

## 2023-09-01 NOTE — Assessment & Plan Note (Signed)
 A1c 5.8 on labs from May 2024.  She has focused on dietary changes in an effort to lose weight and lower her blood sugar in light of this result.  Repeat A1c ordered today.

## 2023-09-01 NOTE — Therapy (Signed)
 OUTPATIENT PHYSICAL THERAPY THORACOLUMBAR TREATMENT   Patient Name: Gina Salazar MRN: [id number] DOB:09-17-1940, 83 y.o., female Today's Date: 09/01/2023  END OF SESSION:  PT End of Session - 09/01/23 1033     Visit Number 2    Number of Visits 12    Date for PT Re-Evaluation 09/16/23    Authorization Type [employer] HMO/PPO    Authorization Time Period No auth    PT Start Time 0803    PT Stop Time 0845    PT Time Calculation (min) 42 min    Activity Tolerance Patient tolerated treatment well    Behavior During Therapy [facility] for tasks assessed/performed              Past Medical History:  Diagnosis Date   Depression    High cholesterol    Hypertension    Past Surgical History:  Procedure Laterality Date   CHOLECYSTECTOMY     TONSILLECTOMY     TOTAL HIP ARTHROPLASTY Right    Patient Active Problem List   Diagnosis Date Noted   Urinary incontinence 09/01/2023   Greater trochanteric bursitis of right hip 05/14/2023   Seborrheic keratoses 05/14/2023   Prediabetes 02/02/2023   Need for influenza vaccination 02/02/2023   Pain of left calf 09/02/2022   Anxiety and depression 08/01/2022   Essential hypertension 08/01/2022   Osteoporosis 08/01/2022   Hyperlipidemia 08/01/2022   Asthma due to seasonal allergies 08/01/2022   Obesity (BMI 30-39.9) 08/01/2022   Encounter for general adult medical examination with abnormal findings 08/01/2022   Skin cancer screening 08/01/2022   Need for pneumococcal 20-valent conjugate vaccination 08/01/2022    PCP: Tobi Fortes, MD  REFERRING PROVIDER: Darrin Emerald, MD  REFERRING DIAG: M54.50 (ICD-10-CM) - Lumbar pain M54.10 (ICD-10-CM) - Radicular pain of right lower extremity  Rationale for Evaluation and Treatment: Rehabilitation  THERAPY DIAG:  Other low back pain  Radicular pain of right lower extremity  Impaired functional mobility, balance, gait, and endurance  ONSET DATE: Chronic LBP over past  year, RLE more recent within last few weeks   SUBJECTIVE:                                                                                                                                                                                           SUBJECTIVE STATEMENT: Pt states pain comes and goes.  Mostly when standing creates LB and nerve pain down Rt LE.  Reports compliance with HEP, however unsure if completing the pelvic tilt correctly.    Evaluation:  Patient reported she's had back pain for a year or so but RLE pain has occurred more  recently. Low back pain feels like an ache. RLE pain just hurts, reports "feels like a line of pain pulling down leg". Primary MD thought it to be bursa but orthopedic MD said it was nerve related so they sent her here. Reports standing, and walking as most times when she has her pain. Cooking is irritating. Reports she can stand for about 15 minutes before having to sit at least 5 minutes and then can start moving again. No issue with prolonged sitting or sleeping   PERTINENT HISTORY:  R THA Torn meniscal repair of R knee   PAIN:  Are you having pain? Yes: NPRS scale: 2 in sitting, 6-7/10 w/ STS and walking Pain location: mid Low back and RLE Pain description: Ache and nerve pain Aggravating factors: standing, walking, or anything when she's up on her feet Relieving factors: Rest, extra strength tylenol  PRECAUTIONS: None  RED FLAGS: None   WEIGHT BEARING RESTRICTIONS: No  FALLS:  Has patient fallen in last 6 months? No  LIVING ENVIRONMENT: Lives with: lives with their family Stairs: Yes: External: 1 steps; on right going up Has following equipment at home: Single point cane  OCCUPATION: Retired, Investment banker, corporate   PLOF: Independent  PATIENT GOALS: "Would like better balance, and less pain and be able to go to [employer]  NEXT MD VISIT: no scheduled appointment besides annual visit  OBJECTIVE:  Note: Objective measures were completed at  Evaluation unless otherwise noted.  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   No acute abnormalities.  PATIENT SURVEYS:  Modified Oswestry Modified Oswestry Low Back Pain Disability Questionnaire: 17 / 50 = 34.0 %   COGNITION: Overall cognitive status: Within functional limits for tasks assessed     SENSATION: WFL   POSTURE: rounded shoulders and forward head  PALPATION: Inc discomfort in low back with CPA at L2 level Tenderness w/ palpation to R piriformis area  LUMBAR ROM:   AROM eval  Flexion 75% avail  Extension 50% avail  * in low back   Right lateral flexion To knee joint, * in low back  Left lateral flexion To knee joint  Right rotation 75% avail  Left rotation 75% avail   (Blank rows = not tested)   *= painful  *Repetitive lumbar flexion 5 reps unremarkable *Repetitive lumbar extension 5 reps w/ increased discomfort to low back only  LOWER EXTREMITY ROM:     Active  Right eval Left eval  Hip flexion    Hip extension    Hip abduction    Hip adduction    Hip internal rotation    Hip external rotation    Knee flexion    Knee extension    Ankle dorsiflexion    Ankle plantarflexion    Ankle inversion    Ankle eversion     (Blank rows = not tested)  LOWER EXTREMITY MMT:    MMT Right eval Left eval  Hip flexion 4- 4  Hip extension 4- 4  Hip abduction 4- 4  Hip adduction    Hip internal rotation    Hip external rotation    Knee flexion 4 4+  Knee extension 4 4+  Ankle dorsiflexion 5 5  Ankle plantarflexion    Ankle inversion    Ankle eversion     (Blank rows = not tested)  LUMBAR SPECIAL TESTS:  Straight leg raise test: Negative and Quadrant test: Positive, pain in low back  FUNCTIONAL TESTS:  2 minute walk  test: 300', pain in back 30" into test, pain in RLE 1 min into test, feels like a burning pain in LE 30 sec chair stand: 11 STS, pain at R hip at 4th  STS GAIT: Distance walked: 300' Assistive device utilized: Single point cane Level of assistance: Modified independence Comments: Antalgic-like gait, dec velocity, Dec step length bilaterally but more apparent on R, dec hip ext bilaterally, dec stance on R  TREATMENT DATE:  09/01/23 Goal review Seated:  piriformis stretch (diff with ROM) Supine:  abdominal bracing 10X5"  Pelvic tilt 10X5"  Single knee to chest 10X 10"  Trunk rotation 10 X 5" each  Hamstring stretch supine with strap 5X30" Sit to stands no UE 10X   08/19/23: PT Eval and HEP                                                                                                                                 PATIENT EDUCATION:  Education details: PT evaluation, objective findings, POC, Importance of HEP, Precautions, Clinic policies  Person educated: Patient Education method: Explanation and Demonstration Education comprehension: verbalized understanding and returned demonstration  HOME EXERCISE PROGRAM: Access Code: [email or web address] URL: [email or web address] Date: 08/19/2023 Prepared by: Virgia Griffins Powell-Butler  Exercises - Supine Lower Trunk Rotation  - 2 x daily - 7 x weekly - 2 sets - 10 reps - Hooklying Single Knee to Chest Stretch  - 2 x daily - 7 x weekly - 2 sets - 10 reps - Pelvic Tilt  - 2 x daily - 7 x weekly - 2 sets - 10 reps - 5 hold - Supine Transversus Abdominis Bracing - Hands on Stomach  - 2 x daily - 7 x weekly - 2 sets - 10 reps - 5 hold  ASSESSMENT:  CLINICAL IMPRESSION: Goals reviewed and POC moving forward.  Pt with questions and difficulty completing pelvic tilts and recruiting core mm; also unsure of hold times. Reviewed these with additional instruction and better understanding and completion following. Additional exercises added to target weak and tight muscles.  No pain or issues with these.  Verbal and tactile cues needed to complete in correct form and hold times.  Pt with good holds and  control with all activities.  No new exercises added to HEP this session.   Patient will benefit from skilled physical therapy in order to address the above to improve function and QOL.     OBJECTIVE IMPAIRMENTS: Abnormal gait, decreased activity tolerance, decreased endurance, decreased mobility, difficulty walking, decreased ROM, decreased strength, improper body mechanics, postural dysfunction, and pain.   ACTIVITY LIMITATIONS: carrying, lifting, bending, standing, squatting, stairs, transfers, and bed mobility  PARTICIPATION LIMITATIONS: meal prep, cleaning, laundry, community activity, and yard work  PERSONAL FACTORS: N/A are also affecting patient's functional outcome.   REHAB POTENTIAL: Good  CLINICAL DECISION MAKING: Stable/uncomplicated  EVALUATION COMPLEXITY: Low   GOALS: Goals reviewed with patient? No  SHORT TERM GOALS: Target date: 09/02/23  Patient will be independent with performance of HEP to demonstrate adequate self management of symptoms.  Baseline:  Goal status: INITIAL  2.   Patient will report at least a 25% improvement with function or pain overall since beginning PT. Baseline:  Goal status: INITIAL   LONG TERM GOALS: Target date: 09/30/23 Patient will improve Oswestry score by  10  points in order to improve self-perceived disability and overall function.  Baseline: Goal status: INITIAL   2.  Patient will report 3/10 pain or less with RLE daily pain in order to improve overall daily function.  Baseline: Goal status: INITIAL   3.  Patient will improve  30 sec chair stand  test to  at least 14 STS in order to improve LE strength and endurance to improve gait . Baseline:  Goal status: INITIAL   4.  Patient will demonstrate at least 75% avail lumbar ext with reports of 2/10 pain or less to be able to perform overhead functional tasks with ease. Baseline:  Goal status: INITIAL   5.  Patient will report overall 50% improvement since beginning  PT. Baseline:  Goal status: INITIAL   PLAN:  PT FREQUENCY: 1-2x/week  PT DURATION: 6 weeks  PLANNED INTERVENTIONS: 97164- PT Re-evaluation, 97110-Therapeutic exercises, 97530- Therapeutic activity, [id number]- Neuromuscular re-education, 97535- Self Care, 78295- Manual therapy, [id number]- Gait training, [phone]- Electrical stimulation (manual), [phone]- Traction (mechanical), Patient/Family education, Balance training, Stair training, Dry Needling, Joint mobilization, Spinal mobilization, and Moist heat.  PLAN FOR NEXT SESSION: Assess balance, slowly re-introduce extension with pt tolerance, core strengthening, RLE strengthening. Update HEP next session. Instruct with supine piriformis stretch next session .    10:35 AM, 09/01/23 Lorenso Romance, PTA/CLT [facility] Ph: [phone]

## 2023-09-01 NOTE — Assessment & Plan Note (Signed)
 Lipid panel last updated in May 2024.  Total cholesterol 178 and LDL 87.  She is currently prescribed atorvastatin 10 mg daily.  Repeat lipid panel ordered today.

## 2023-09-01 NOTE — Assessment & Plan Note (Signed)
 Remains adequately controlled on current antihypertensive regimen.

## 2023-09-01 NOTE — Assessment & Plan Note (Signed)
 Known history of osteoporosis.  She remains on Fosamax 70 mg weekly as well as vitamin D  + calcium supplementation.

## 2023-09-04 ENCOUNTER — Ambulatory Visit (HOSPITAL_COMMUNITY)

## 2023-09-04 DIAGNOSIS — R2689 Other abnormalities of gait and mobility: Secondary | ICD-10-CM

## 2023-09-04 DIAGNOSIS — M5459 Other low back pain: Secondary | ICD-10-CM

## 2023-09-04 DIAGNOSIS — M5410 Radiculopathy, site unspecified: Secondary | ICD-10-CM | POA: Diagnosis not present

## 2023-09-04 DIAGNOSIS — R262 Difficulty in walking, not elsewhere classified: Secondary | ICD-10-CM

## 2023-09-04 DIAGNOSIS — Z7409 Other reduced mobility: Secondary | ICD-10-CM

## 2023-09-04 NOTE — Therapy (Signed)
 OUTPATIENT PHYSICAL THERAPY THORACOLUMBAR TREATMENT   Patient Name: Gina Salazar MRN: [id number] DOB:1940/12/27, 83 y.o., female Today's Date: 09/04/2023  END OF SESSION:  PT End of Session - 09/04/23 1026     Visit Number 3    Number of Visits 12    Date for PT Re-Evaluation 09/16/23    Authorization Type [employer] Medicare HMO/PPO    Authorization Time Period No auth    PT Start Time 1024   late arrival   PT Stop Time 1100    PT Time Calculation (min) 36 min    Activity Tolerance Patient tolerated treatment well    Behavior During Therapy WFL for tasks assessed/performed              Past Medical History:  Diagnosis Date   Depression    High cholesterol    Hypertension    Past Surgical History:  Procedure Laterality Date   CHOLECYSTECTOMY     TONSILLECTOMY     TOTAL HIP ARTHROPLASTY Right    Patient Active Problem List   Diagnosis Date Noted   Urinary incontinence 09/01/2023   Greater trochanteric bursitis of right hip 05/14/2023   Seborrheic keratoses 05/14/2023   Prediabetes 02/02/2023   Need for influenza vaccination 02/02/2023   Pain of left calf 09/02/2022   Anxiety and depression 08/01/2022   Essential hypertension 08/01/2022   Osteoporosis 08/01/2022   Hyperlipidemia 08/01/2022   Asthma due to seasonal allergies 08/01/2022   Obesity (BMI 30-39.9) 08/01/2022   Encounter for general adult medical examination with abnormal findings 08/01/2022   Skin cancer screening 08/01/2022   Need for pneumococcal 20-valent conjugate vaccination 08/01/2022    PCP: Tobi Fortes, MD  REFERRING PROVIDER: Darrin Emerald, MD  REFERRING DIAG: M54.50 (ICD-10-CM) - Lumbar pain M54.10 (ICD-10-CM) - Radicular pain of right lower extremity  Rationale for Evaluation and Treatment: Rehabilitation  THERAPY DIAG:  Other low back pain  Radicular pain of right lower extremity  Impaired functional mobility, balance, gait, and endurance  Difficulty in walking,  not elsewhere classified  Other abnormalities of gait and mobility  ONSET DATE: Chronic LBP over past year, RLE more recent within last few weeks   SUBJECTIVE:                                                                                                                                                                                           SUBJECTIVE STATEMENT: "Hurting this morning"; reports some new pain left side low back and hip today that is sharp with movement; 7/10   Evaluation:  Patient reported she's had back pain for a year or so but  RLE pain has occurred more recently. Low back pain feels like an ache. RLE pain just hurts, reports "feels like a line of pain pulling down leg". Primary MD thought it to be bursa but orthopedic MD said it was nerve related so they sent her here. Reports standing, and walking as most times when she has her pain. Cooking is irritating. Reports she can stand for about 15 minutes before having to sit at least 5 minutes and then can start moving again. No issue with prolonged sitting or sleeping   PERTINENT HISTORY:  R THA Torn meniscal repair of R knee   PAIN:  Are you having pain? Yes: NPRS scale: 2 in sitting, 6-7/10 w/ STS and walking Pain location: mid Low back and RLE Pain description: Ache and nerve pain Aggravating factors: standing, walking, or anything when she's up on her feet Relieving factors: Rest, extra strength tylenol  PRECAUTIONS: None  RED FLAGS: None   WEIGHT BEARING RESTRICTIONS: No  FALLS:  Has patient fallen in last 6 months? No  LIVING ENVIRONMENT: Lives with: lives with their family Stairs: Yes: External: 1 steps; on right going up Has following equipment at home: Single point cane  OCCUPATION: Retired, Investment banker, corporate   PLOF: Independent  PATIENT GOALS: "Would like better balance, and less pain and be able to go to [employer]  NEXT MD VISIT: no scheduled appointment besides annual visit  OBJECTIVE:  Note:  Objective measures were completed at Evaluation unless otherwise noted.  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   No acute abnormalities.  PATIENT SURVEYS:  Modified Oswestry Modified Oswestry Low Back Pain Disability Questionnaire: 17 / 50 = 34.0 %   COGNITION: Overall cognitive status: Within functional limits for tasks assessed     SENSATION: WFL   POSTURE: rounded shoulders and forward head  PALPATION: Inc discomfort in low back with CPA at L2 level Tenderness w/ palpation to R piriformis area  LUMBAR ROM:   AROM eval  Flexion 75% avail  Extension 50% avail  * in low back   Right lateral flexion To knee joint, * in low back  Left lateral flexion To knee joint  Right rotation 75% avail  Left rotation 75% avail   (Blank rows = not tested)   *= painful  *Repetitive lumbar flexion 5 reps unremarkable *Repetitive lumbar extension 5 reps w/ increased discomfort to low back only  LOWER EXTREMITY ROM:     Active  Right eval Left eval  Hip flexion    Hip extension    Hip abduction    Hip adduction    Hip internal rotation    Hip external rotation    Knee flexion    Knee extension    Ankle dorsiflexion    Ankle plantarflexion    Ankle inversion    Ankle eversion     (Blank rows = not tested)  LOWER EXTREMITY MMT:    MMT Right eval Left eval  Hip flexion 4- 4  Hip extension 4- 4  Hip abduction 4- 4  Hip adduction    Hip internal rotation    Hip external rotation    Knee flexion 4 4+  Knee extension 4 4+  Ankle dorsiflexion 5 5  Ankle plantarflexion    Ankle inversion    Ankle eversion     (Blank rows = not tested)  LUMBAR SPECIAL TESTS:  Straight leg raise test: Negative and Quadrant test: Positive, pain in low back  FUNCTIONAL  TESTS:  2 minute walk test: 300', pain in back 30" into test, pain in RLE 1 min into test, feels like a burning pain in LE 30 sec  chair stand: 11 STS, pain at R hip at 4th STS GAIT: Distance walked: 300' Assistive device utilized: Single point cane Level of assistance: Modified independence Comments: Antalgic-like gait, dec velocity, Dec step length bilaterally but more apparent on R, dec hip ext bilaterally, dec stance on R  TREATMENT DATE:  09/04/23 Supine: Decompressed position in supine with moist heat x 5' to decrease pain and improve soft tissue extensibility Abdominal bracing 5" hold x 10 Pelvic tilt 5" hold x 10 Single knee to chest 10 x  10" Active Hamstring stretch  3 x 20" LTR x 10 x 5" Seated piriformis stretch 3 x 20"     09/01/23 Goal review Seated:  piriformis stretch (diff with ROM) Supine:  abdominal bracing 10X5"  Pelvic tilt 10X5"  Single knee to chest 10X 10"  Trunk rotation 10 X 5" each  Hamstring stretch supine with strap 5X30" Sit to stands no UE 10X   08/19/23: PT Eval and HEP                                                                                                                                 PATIENT EDUCATION:  Education details: PT evaluation, objective findings, POC, Importance of HEP, Precautions, Clinic policies  Person educated: Patient Education method: Explanation and Demonstration Education comprehension: verbalized understanding and returned demonstration  HOME EXERCISE PROGRAM: Access Code: [email or web address] URL: [email or web address] Date: 08/19/2023 Prepared by: Virgia Griffins Powell-Butler  Exercises - Supine Lower Trunk Rotation  - 2 x daily - 7 x weekly - 2 sets - 10 reps - Hooklying Single Knee to Chest Stretch  - 2 x daily - 7 x weekly - 2 sets - 10 reps - Pelvic Tilt  - 2 x daily - 7 x weekly - 2 sets - 10 reps - 5 hold - Supine Transversus Abdominis Bracing - Hands on Stomach  - 2 x daily - 7 x weekly - 2 sets - 10 reps - 5 hold  ASSESSMENT:  CLINICAL IMPRESSION: Today's session with focus on decreasing pain and improving mobility.  Continued  with stretching; needs cues for stretching to avoid pain.  Decreased pain after treatment noted.    Patient will benefit from skilled physical therapy in order to address the above to improve function and QOL.     OBJECTIVE IMPAIRMENTS: Abnormal gait, decreased activity tolerance, decreased endurance, decreased mobility, difficulty walking, decreased ROM, decreased strength, improper body mechanics, postural dysfunction, and pain.   ACTIVITY LIMITATIONS: carrying, lifting, bending, standing, squatting, stairs, transfers, and bed mobility  PARTICIPATION LIMITATIONS: meal prep, cleaning, laundry, community activity, and yard work  PERSONAL FACTORS: N/A are also affecting patient's functional outcome.   REHAB POTENTIAL: Good  CLINICAL DECISION MAKING: Stable/uncomplicated  EVALUATION COMPLEXITY: Low   GOALS: Goals reviewed  with patient? No  SHORT TERM GOALS: Target date: 09/02/23 Patient will be independent with performance of HEP to demonstrate adequate self management of symptoms.  Baseline:  Goal status: INITIAL  2.   Patient will report at least a 25% improvement with function or pain overall since beginning PT. Baseline:  Goal status: INITIAL   LONG TERM GOALS: Target date: 09/30/23 Patient will improve Oswestry score by  10  points in order to improve self-perceived disability and overall function.  Baseline: Goal status: INITIAL   2.  Patient will report 3/10 pain or less with RLE daily pain in order to improve overall daily function.  Baseline: Goal status: INITIAL   3.  Patient will improve  30 sec chair stand  test to  at least 14 STS in order to improve LE strength and endurance to improve gait . Baseline:  Goal status: INITIAL   4.  Patient will demonstrate at least 75% avail lumbar ext with reports of 2/10 pain or less to be able to perform overhead functional tasks with ease. Baseline:  Goal status: INITIAL   5.  Patient will report overall 50% improvement since  beginning PT. Baseline:  Goal status: INITIAL   PLAN:  PT FREQUENCY: 1-2x/week  PT DURATION: 6 weeks  PLANNED INTERVENTIONS: 97164- PT Re-evaluation, 97110-Therapeutic exercises, 97530- Therapeutic activity, [id number]- Neuromuscular re-education, 97535- Self Care, 16109- Manual therapy, [id number]- Gait training, [phone]- Electrical stimulation (manual), [phone]- Traction (mechanical), Patient/Family education, Balance training, Stair training, Dry Needling, Joint mobilization, Spinal mobilization, and Moist heat.  PLAN FOR NEXT SESSION: Assess balance, slowly re-introduce extension with pt tolerance, core strengthening, RLE strengthening. Update HEP next session. Instruct with supine piriformis stretch next session .    10:27 AM, 09/04/23 Fabricio Endsley Small Dawnisha Marquina MPT [facility] [place] [phone]

## 2023-09-08 ENCOUNTER — Ambulatory Visit (HOSPITAL_COMMUNITY): Admitting: Physical Therapy

## 2023-09-08 DIAGNOSIS — M5410 Radiculopathy, site unspecified: Secondary | ICD-10-CM

## 2023-09-08 DIAGNOSIS — M5459 Other low back pain: Secondary | ICD-10-CM

## 2023-09-08 DIAGNOSIS — Z7409 Other reduced mobility: Secondary | ICD-10-CM | POA: Diagnosis not present

## 2023-09-08 DIAGNOSIS — R262 Difficulty in walking, not elsewhere classified: Secondary | ICD-10-CM | POA: Diagnosis not present

## 2023-09-08 DIAGNOSIS — R2689 Other abnormalities of gait and mobility: Secondary | ICD-10-CM | POA: Diagnosis not present

## 2023-09-08 NOTE — Therapy (Signed)
 OUTPATIENT PHYSICAL THERAPY THORACOLUMBAR TREATMENT   Patient Name: Gina Salazar MRN: [id number] DOB:[birth year], 83 y.o., female Today's Date: 09/08/2023  END OF SESSION:  PT End of Session - 09/08/23 1240     Visit Number 4    Number of Visits 12    Date for PT Re-Evaluation 09/16/23    Authorization Type [employer] HMO/PPO    Authorization Time Period No auth    PT Start Time 1020    PT Stop Time 1100    PT Time Calculation (min) 40 min    Activity Tolerance Patient tolerated treatment well    Behavior During Therapy WFL for tasks assessed/performed               Past Medical History:  Diagnosis Date   Depression    High cholesterol    Hypertension    Past Surgical History:  Procedure Laterality Date   CHOLECYSTECTOMY     TONSILLECTOMY     TOTAL HIP ARTHROPLASTY Right    Patient Active Problem List   Diagnosis Date Noted   Urinary incontinence 09/01/2023   Greater trochanteric bursitis of right hip 05/14/2023   Seborrheic keratoses 05/14/2023   Prediabetes 02/02/2023   Need for influenza vaccination 02/02/2023   Pain of left calf 09/02/2022   Anxiety and depression 08/01/2022   Essential hypertension 08/01/2022   Osteoporosis 08/01/2022   Hyperlipidemia 08/01/2022   Asthma due to seasonal allergies 08/01/2022   Obesity (BMI 30-39.9) 08/01/2022   Encounter for general adult medical examination with abnormal findings 08/01/2022   Skin cancer screening 08/01/2022   Need for pneumococcal 20-valent conjugate vaccination 08/01/2022    PCP: Tobi Fortes, MD  REFERRING PROVIDER: Darrin Emerald, MD  REFERRING DIAG: M54.50 (ICD-10-CM) - Lumbar pain M54.10 (ICD-10-CM) - Radicular pain of right lower extremity  Rationale for Evaluation and Treatment: Rehabilitation  THERAPY DIAG:  Other low back pain  Radicular pain of right lower extremity  Impaired functional mobility, balance, gait, and endurance  Difficulty in walking, not  elsewhere classified  ONSET DATE: Chronic LBP over past year, RLE more recent within last few weeks   SUBJECTIVE:                                                                                                                                                                                           SUBJECTIVE STATEMENT: Doing well today overall.  Still have pain but moving better.  Evaluation:  Patient reported she's had back pain for a year or so but RLE pain has occurred more recently. Low back pain feels like an ache. RLE pain just hurts, reports "feels  like a line of pain pulling down leg". Primary MD thought it to be bursa but orthopedic MD said it was nerve related so they sent her here. Reports standing, and walking as most times when she has her pain. Cooking is irritating. Reports she can stand for about 15 minutes before having to sit at least 5 minutes and then can start moving again. No issue with prolonged sitting or sleeping   PERTINENT HISTORY:  R THA Torn meniscal repair of R knee   PAIN:  Are you having pain? Yes: NPRS scale: 2 in sitting, 6-7/10 w/ STS and walking Pain location: mid Low back and RLE Pain description: Ache and nerve pain Aggravating factors: standing, walking, or anything when she's up on her feet Relieving factors: Rest, extra strength tylenol  PRECAUTIONS: None  RED FLAGS: None   WEIGHT BEARING RESTRICTIONS: No  FALLS:  Has patient fallen in last 6 months? No  LIVING ENVIRONMENT: Lives with: lives with their family Stairs: Yes: External: 1 steps; on right going up Has following equipment at home: Single point cane  OCCUPATION: Retired, Investment banker, corporate   PLOF: Independent  PATIENT GOALS: "Would like better balance, and less pain and be able to go to [employer]  NEXT MD VISIT: no scheduled appointment besides annual visit  OBJECTIVE:  Note: Objective measures were completed at Evaluation unless otherwise noted.  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   No acute abnormalities.  PATIENT SURVEYS:  Modified Oswestry Modified Oswestry Low Back Pain Disability Questionnaire: 17 / 50 = 34.0 %   COGNITION: Overall cognitive status: Within functional limits for tasks assessed     SENSATION: WFL   POSTURE: rounded shoulders and forward head  PALPATION: Inc discomfort in low back with CPA at L2 level Tenderness w/ palpation to R piriformis area  LUMBAR ROM:   AROM eval  Flexion 75% avail  Extension 50% avail  * in low back   Right lateral flexion To knee joint, * in low back  Left lateral flexion To knee joint  Right rotation 75% avail  Left rotation 75% avail   (Blank rows = not tested)   *= painful  *Repetitive lumbar flexion 5 reps unremarkable *Repetitive lumbar extension 5 reps w/ increased discomfort to low back only  LOWER EXTREMITY ROM:     Active  Right eval Left eval  Hip flexion    Hip extension    Hip abduction    Hip adduction    Hip internal rotation    Hip external rotation    Knee flexion    Knee extension    Ankle dorsiflexion    Ankle plantarflexion    Ankle inversion    Ankle eversion     (Blank rows = not tested)  LOWER EXTREMITY MMT:    MMT Right eval Left eval  Hip flexion 4- 4  Hip extension 4- 4  Hip abduction 4- 4  Hip adduction    Hip internal rotation    Hip external rotation    Knee flexion 4 4+  Knee extension 4 4+  Ankle dorsiflexion 5 5  Ankle plantarflexion    Ankle inversion    Ankle eversion     (Blank rows = not tested)  LUMBAR SPECIAL TESTS:  Straight leg raise test: Negative and Quadrant test: Positive, pain in low back  FUNCTIONAL TESTS:  2 minute walk test: 300', pain in back 30" into test, pain in RLE 1 min into  test, feels like a burning pain in LE 30 sec chair stand: 11 STS, pain at R hip at 4th STS GAIT: Distance walked: 300' Assistive device  utilized: Single point cane Level of assistance: Modified independence Comments: Antalgic-like gait, dec velocity, Dec step length bilaterally but more apparent on R, dec hip ext bilaterally, dec stance on R  TREATMENT DATE:  09/08/23 Supine:  bridge 10X  Piriformis stretch 3X20"  Hamstring stretch with strap 3X20"  Straight leg raise 10X each  Lower trunk rotations 10X5" each side Sit to stands 10X standard chair no UE Nustep EOS 5 minutes UE/LE level 3  09/04/23 Supine: Decompressed position in supine with moist heat x 5' to decrease pain and improve soft tissue extensibility Abdominal bracing 5" hold x 10 Pelvic tilt 5" hold x 10 Single knee to chest 10 x  10" Active Hamstring stretch  3 x 20" LTR x 10 x 5" Seated piriformis stretch 3 x 20"   09/01/23 Goal review Seated:  piriformis stretch (diff with ROM) Supine:  abdominal bracing 10X5"  Pelvic tilt 10X5"  Single knee to chest 10X 10"  Trunk rotation 10 X 5" each  Hamstring stretch supine with strap 5X30" Sit to stands no UE 10X   08/19/23: PT Eval and HEP                                                                                                                                 PATIENT EDUCATION:  Education details: PT evaluation, objective findings, POC, Importance of HEP, Precautions, Clinic policies  Person educated: Patient Education method: Explanation and Demonstration Education comprehension: verbalized understanding and returned demonstration  HOME EXERCISE PROGRAM: Access Code: [email or web address] URL: [email or web address] Date: 08/19/2023 Prepared by: Virgia Griffins Powell-Butler  Exercises - Supine Lower Trunk Rotation  - 2 x daily - 7 x weekly - 2 sets - 10 reps - Hooklying Single Knee to Chest Stretch  - 2 x daily - 7 x weekly - 2 sets - 10 reps - Pelvic Tilt  - 2 x daily - 7 x weekly - 2 sets - 10 reps - 5 hold - Supine Transversus Abdominis Bracing - Hands on Stomach  - 2 x daily - 7 x weekly - 2  sets - 10 reps - 5 hold   Date: 09/08/2023 Prepared by: Vickii Grand - Supine Bridge  - 2 x daily - 7 x weekly - 2 sets - 10 reps - Small Range Straight Leg Raise  - 2 x daily - 7 x weekly - 2 sets - 10 reps - Supine Piriformis Stretch with Foot on Ground  - 2 x daily - 7 x weekly - 2 sets - 3 reps - 30 sec hold - Hooklying Hamstring Stretch with Strap  - 2 x daily - 7 x weekly - 2 sets - 3 reps - 30 sec hold - Sit to Stand  - 2 x daily - 7 x weekly -  2 sets - 5 reps  ASSESSMENT:  CLINICAL IMPRESSION: Continued with established therex with additional stabilization activities added as well.  Instructed with piriformis stretch in supine as reported discomfort with completing in seated.  Updated HEP to include added therex to this point.  Pt instructed to complete 1-2X daily.  Today's session with focus on decreasing pain and improving mobility.  Continued with stretching; needs cues for stretching to avoid pain.  Decreased pain after treatment noted.    Patient will benefit from skilled physical therapy in order to address the above to improve function and QOL.     OBJECTIVE IMPAIRMENTS: Abnormal gait, decreased activity tolerance, decreased endurance, decreased mobility, difficulty walking, decreased ROM, decreased strength, improper body mechanics, postural dysfunction, and pain.   ACTIVITY LIMITATIONS: carrying, lifting, bending, standing, squatting, stairs, transfers, and bed mobility  PARTICIPATION LIMITATIONS: meal prep, cleaning, laundry, community activity, and yard work  PERSONAL FACTORS: N/A are also affecting patient's functional outcome.   REHAB POTENTIAL: Good  CLINICAL DECISION MAKING: Stable/uncomplicated  EVALUATION COMPLEXITY: Low   GOALS: Goals reviewed with patient? No  SHORT TERM GOALS: Target date: 09/02/23 Patient will be independent with performance of HEP to demonstrate adequate self management of symptoms.  Baseline:  Goal status: INITIAL  2.   Patient will  report at least a 25% improvement with function or pain overall since beginning PT. Baseline:  Goal status: INITIAL   LONG TERM GOALS: Target date: 09/30/23 Patient will improve Oswestry score by  10  points in order to improve self-perceived disability and overall function.  Baseline: Goal status: INITIAL   2.  Patient will report 3/10 pain or less with RLE daily pain in order to improve overall daily function.  Baseline: Goal status: INITIAL   3.  Patient will improve  30 sec chair stand  test to  at least 14 STS in order to improve LE strength and endurance to improve gait . Baseline:  Goal status: INITIAL   4.  Patient will demonstrate at least 75% avail lumbar ext with reports of 2/10 pain or less to be able to perform overhead functional tasks with ease. Baseline:  Goal status: INITIAL   5.  Patient will report overall 50% improvement since beginning PT. Baseline:  Goal status: INITIAL   PLAN:  PT FREQUENCY: 1-2x/week  PT DURATION: 6 weeks  PLANNED INTERVENTIONS: 97164- PT Re-evaluation, 97110-Therapeutic exercises, 97530- Therapeutic activity, [id number]- Neuromuscular re-education, 97535- Self Care, 65784- Manual therapy, [id number]- Gait training, [phone]- Electrical stimulation (manual), [phone]- Traction (mechanical), Patient/Family education, Balance training, Stair training, Dry Needling, Joint mobilization, Spinal mobilization, and Moist heat.  PLAN FOR NEXT SESSION: Assess balance, slowly re-introduce extension with pt tolerance, core strengthening, RLE strengthening.   12:41 PM, 09/08/23 Lorenso Romance, PTA/CLT [facility] Ph: [phone]

## 2023-09-10 ENCOUNTER — Encounter (HOSPITAL_COMMUNITY)

## 2023-09-15 ENCOUNTER — Encounter (HOSPITAL_COMMUNITY): Payer: Self-pay

## 2023-09-15 ENCOUNTER — Ambulatory Visit (HOSPITAL_COMMUNITY)

## 2023-09-15 DIAGNOSIS — Z7409 Other reduced mobility: Secondary | ICD-10-CM

## 2023-09-15 DIAGNOSIS — M5459 Other low back pain: Secondary | ICD-10-CM | POA: Diagnosis not present

## 2023-09-15 DIAGNOSIS — R2689 Other abnormalities of gait and mobility: Secondary | ICD-10-CM | POA: Diagnosis not present

## 2023-09-15 DIAGNOSIS — M5410 Radiculopathy, site unspecified: Secondary | ICD-10-CM | POA: Diagnosis not present

## 2023-09-15 DIAGNOSIS — R262 Difficulty in walking, not elsewhere classified: Secondary | ICD-10-CM | POA: Diagnosis not present

## 2023-09-15 NOTE — Therapy (Signed)
 OUTPATIENT PHYSICAL THERAPY THORACOLUMBAR TREATMENT/PROGRESS NOTE    Patient Name: Gina Salazar MRN: [id number] DOB:05/18/1940, 83 y.o., female Today's Date: 09/15/2023   Progress Note Reporting Period 08/19/23 to 09/15/23  See note below for Objective Data and Assessment of Progress/Goals.      END OF SESSION:  PT End of Session - 09/15/23 1109     Visit Number 5    Number of Visits 12    Date for PT Re-Evaluation 10/13/23    Authorization Type [employer] Medicare HMO/PPO    Authorization Time Period No auth    PT Start Time 1110    PT Stop Time 1142    PT Time Calculation (min) 32 min    Activity Tolerance Patient tolerated treatment well    Behavior During Therapy WFL for tasks assessed/performed            Past Medical History:  Diagnosis Date   Depression    High cholesterol    Hypertension    Past Surgical History:  Procedure Laterality Date   CHOLECYSTECTOMY     TONSILLECTOMY     TOTAL HIP ARTHROPLASTY Right    Patient Active Problem List   Diagnosis Date Noted   Urinary incontinence 09/01/2023   Greater trochanteric bursitis of right hip 05/14/2023   Seborrheic keratoses 05/14/2023   Prediabetes 02/02/2023   Need for influenza vaccination 02/02/2023   Pain of left calf 09/02/2022   Anxiety and depression 08/01/2022   Essential hypertension 08/01/2022   Osteoporosis 08/01/2022   Hyperlipidemia 08/01/2022   Asthma due to seasonal allergies 08/01/2022   Obesity (BMI 30-39.9) 08/01/2022   Encounter for general adult medical examination with abnormal findings 08/01/2022   Skin cancer screening 08/01/2022   Need for pneumococcal 20-valent conjugate vaccination 08/01/2022    PCP: Tobi Fortes, MD  REFERRING PROVIDER: Darrin Emerald, MD  REFERRING DIAG: M54.50 (ICD-10-CM) - Lumbar pain M54.10 (ICD-10-CM) - Radicular pain of right lower extremity  Rationale for Evaluation and Treatment: Rehabilitation  THERAPY DIAG:  Other low back  pain  Radicular pain of right lower extremity  Impaired functional mobility, balance, gait, and endurance  ONSET DATE: Chronic LBP over past year, RLE more recent within last few weeks   SUBJECTIVE:                                                                                                                                                                                           SUBJECTIVE STATEMENT: Pt late to session. Pt reports she she feels okay today. Feeling wobbly. Reports 0/10 pain. HEP going well. Doing some everyday, breaking it up throughout day.  Evaluation:  Patient reported she's had back pain for a year or so but RLE pain has occurred more recently. Low back pain feels like an ache. RLE pain just hurts, reports feels like a line of pain pulling down leg. Primary MD thought it to be bursa but orthopedic MD said it was nerve related so they sent her here. Reports standing, and walking as most times when she has her pain. Cooking is irritating. Reports she can stand for about 15 minutes before having to sit at least 5 minutes and then can start moving again. No issue with prolonged sitting or sleeping   PERTINENT HISTORY:  R THA Torn meniscal repair of R knee   PAIN:  Are you having pain? Yes: NPRS scale: 2 in sitting, 6-7/10 w/ STS and walking Pain location: mid Low back and RLE Pain description: Ache and nerve pain Aggravating factors: standing, walking, or anything when she's up on her feet Relieving factors: Rest, extra strength tylenol  PRECAUTIONS: None  RED FLAGS: None   WEIGHT BEARING RESTRICTIONS: No  FALLS:  Has patient fallen in last 6 months? No  LIVING ENVIRONMENT: Lives with: lives with their family Stairs: Yes: External: 1 steps; on right going up Has following equipment at home: Single point cane  OCCUPATION: Retired, Investment banker, corporate   PLOF: Independent  PATIENT GOALS: Would like better balance, and less pain and be able to go to  [employer]  NEXT MD VISIT: no scheduled appointment besides annual visit  OBJECTIVE:  Note: Objective measures were completed at Evaluation unless otherwise noted.  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   No acute abnormalities.  PATIENT SURVEYS:  Modified Oswestry Modified Oswestry Low Back Pain Disability Questionnaire: 17 / 50 = 34.0 %  09/15/23: Modified Oswestry Low Back Pain Disability Questionnaire: 19 / 50 = 38.0 %   COGNITION: Overall cognitive status: Within functional limits for tasks assessed     SENSATION: WFL   POSTURE: rounded shoulders and forward head  PALPATION: Inc discomfort in low back with CPA at L2 level Tenderness w/ palpation to R piriformis area  LUMBAR ROM:   AROM eval 09/15/23  Flexion 75% avail 100%  Extension 50% avail  * in low back  50% avail  Right lateral flexion To knee joint, * in low back   Left lateral flexion To knee joint   Right rotation 75% avail   Left rotation 75% avail    (Blank rows = not tested)   *= painful  *Repetitive lumbar flexion 5 reps unremarkable *Repetitive lumbar extension 5 reps w/ increased discomfort to low back only  LOWER EXTREMITY ROM:     Active  Right eval Left eval  Hip flexion    Hip extension    Hip abduction    Hip adduction    Hip internal rotation    Hip external rotation    Knee flexion    Knee extension    Ankle dorsiflexion    Ankle plantarflexion    Ankle inversion    Ankle eversion     (Blank rows = not tested)  LOWER EXTREMITY MMT:    MMT Right eval Left eval Right 09/15/23 Left 09/15/23  Hip flexion 4- 4 4+ 4+  Hip extension 4- 4 4 4   Hip abduction 4- 4 4+ 4+  Hip adduction      Hip internal rotation      Hip external rotation      Knee flexion 4  4+ 4 4+  Knee extension 4 4+ 4+ 4+  Ankle dorsiflexion 5 5    Ankle plantarflexion      Ankle inversion      Ankle eversion        (Blank rows = not tested)  LUMBAR SPECIAL TESTS:  Straight leg raise test: Negative and Quadrant test: Positive, pain in low back 09/15/23: Slump test negative  FUNCTIONAL TESTS:  2 minute walk test: 300', pain in back 30 into test, pain in RLE 1 min into test, feels like a burning pain in LE 30 sec chair stand: 11 STS, pain at R hip at 4th STS  09/15/23: 30 sec STS: 14 STS 2 minute Walk Test: 369', half without SPC., R hip pain at end GAIT: Distance walked: 300' Assistive device utilized: Single point cane Level of assistance: Modified independence Comments: Antalgic-like gait, dec velocity, Dec step length bilaterally but more apparent on R, dec hip ext bilaterally, dec stance on R  TREATMENT DATE:  09/15/23: Progress Note: Modified oswestry Lumbar ROM LE MMT 30 sec chair stand test 2 min walk test Slump test   09/08/23 Supine:  bridge 10X  Piriformis stretch 3X20  Hamstring stretch with strap 3X20  Straight leg raise 10X each  Lower trunk rotations 10X5 each side Sit to stands 10X standard chair no UE Nustep EOS 5 minutes UE/LE level 3  09/04/23 Supine: Decompressed position in supine with moist heat x 5' to decrease pain and improve soft tissue extensibility Abdominal bracing 5 hold x 10 Pelvic tilt 5 hold x 10 Single knee to chest 10 x  10 Active Hamstring stretch  3 x 20 LTR x 10 x 5 Seated piriformis stretch 3 x 20   PATIENT EDUCATION:  Education details: PT evaluation, objective findings, POC, Importance of HEP, Precautions, Clinic policies  Person educated: Patient Education method: Explanation and Demonstration Education comprehension: verbalized understanding and returned demonstration  HOME EXERCISE PROGRAM: Access Code: [email or web address] URL: [email or web address] Date: 08/19/2023 Prepared by: Virgia Griffins Powell-Butler  Exercises - Supine Lower Trunk Rotation  - 2 x daily - 7 x weekly - 2 sets - 10 reps - Hooklying Single Knee to Chest  Stretch  - 2 x daily - 7 x weekly - 2 sets - 10 reps - Pelvic Tilt  - 2 x daily - 7 x weekly - 2 sets - 10 reps - 5 hold - Supine Transversus Abdominis Bracing - Hands on Stomach  - 2 x daily - 7 x weekly - 2 sets - 10 reps - 5 hold   Date: 09/08/2023 Prepared by: Vickii Grand - Supine Bridge  - 2 x daily - 7 x weekly - 2 sets - 10 reps - Small Range Straight Leg Raise  - 2 x daily - 7 x weekly - 2 sets - 10 reps - Supine Piriformis Stretch with Foot on Ground  - 2 x daily - 7 x weekly - 2 sets - 3 reps - 30 sec hold - Hooklying Hamstring Stretch with Strap  - 2 x daily - 7 x weekly - 2 sets - 3 reps - 30 sec hold - Sit to Stand  - 2 x daily - 7 x weekly - 2 sets - 5 reps  ASSESSMENT:  CLINICAL IMPRESSION: Progress note performed this date. Patient demonstrating increased improvements with Lumbar ROM and LE strength and endurance via MMT, 30 second chair stand test, and 2 minute walk testing.  Patient reports she feels her strength gains but continues to have  nerve-like pain that radiates down RLE usually to thigh but occasionally to foot. Pain mostly comes on after prolonged standing Unable to reproduce pain with slump test or repeated lumbar extension this date. Will plan to address this is in upcoming sessions. Patient will benefit from skilled physical therapy in order to address the above to improve function and QOL.    OBJECTIVE IMPAIRMENTS: Abnormal gait, decreased activity tolerance, decreased endurance, decreased mobility, difficulty walking, decreased ROM, decreased strength, improper body mechanics, postural dysfunction, and pain.   ACTIVITY LIMITATIONS: carrying, lifting, bending, standing, squatting, stairs, transfers, and bed mobility  PARTICIPATION LIMITATIONS: meal prep, cleaning, laundry, community activity, and yard work  PERSONAL FACTORS: N/A are also affecting patient's functional outcome.   REHAB POTENTIAL: Good  CLINICAL DECISION MAKING:  Stable/uncomplicated  EVALUATION COMPLEXITY: Low   GOALS: Goals reviewed with patient? No  SHORT TERM GOALS: Target date: 09/29/23 Patient will be independent with performance of HEP to demonstrate adequate self management of symptoms.  Baseline:  Goal status: MET  2.   Patient will report at least a 25% improvement with function or pain overall since beginning PT. Baseline: REPORTS 10% ON 09/15/23 Goal status: IN PROGRESS   LONG TERM GOALS: Target date: 10/07/23 Patient will improve Oswestry score by  10  points in order to improve self-perceived disability and overall function.  Baseline: Goal status: IN PROGRESS   2.  Patient will report 3/10 pain or less with RLE daily pain in order to improve overall daily function.  Baseline: reports moderate to severe with prolonged standing (ie. Cooking) on 09/15/23 Goal status: IN PROGRESS   3.  Patient will improve  30 sec chair stand  test to  at least 16 STS in order to improve LE strength and endurance to improve gait . Baseline: 14 ON 09/15/23 Goal status: REVISED ON 09/15/23   4.  Patient will demonstrate at least 75% avail lumbar ext with reports of 2/10 pain or less to be able to perform overhead functional tasks with ease. Baseline:  Goal status: IN PROGRESS   5.  Patient will report overall 50% improvement since beginning PT. Baseline:  Goal status: IN PROGRESS   PLAN:  PT FREQUENCY: 1-2x/week  PT DURATION: 6 weeks  PLANNED INTERVENTIONS: 97164- PT Re-evaluation, 97110-Therapeutic exercises, 97530- Therapeutic activity, [id number]- Neuromuscular re-education, 97535- Self Care, 16109- Manual therapy, [id number]- Gait training, [phone]- Electrical stimulation (manual), [phone]- Traction (mechanical), Patient/Family education, Balance training, Stair training, Dry Needling, Joint mobilization, Spinal mobilization, and Moist heat.  PLAN FOR NEXT SESSION: Assess balance, slowly re-introduce extension with pt tolerance, core strengthening, RLE  strengthening.   11:42 AM, 09/15/23 Marysue Sola, PT, DPT [facility] [place]

## 2023-09-17 ENCOUNTER — Encounter (HOSPITAL_COMMUNITY): Payer: Self-pay

## 2023-09-17 ENCOUNTER — Ambulatory Visit (HOSPITAL_COMMUNITY)

## 2023-09-17 DIAGNOSIS — R2689 Other abnormalities of gait and mobility: Secondary | ICD-10-CM | POA: Diagnosis not present

## 2023-09-17 DIAGNOSIS — R262 Difficulty in walking, not elsewhere classified: Secondary | ICD-10-CM

## 2023-09-17 DIAGNOSIS — M5459 Other low back pain: Secondary | ICD-10-CM

## 2023-09-17 DIAGNOSIS — M5410 Radiculopathy, site unspecified: Secondary | ICD-10-CM

## 2023-09-17 DIAGNOSIS — Z7409 Other reduced mobility: Secondary | ICD-10-CM

## 2023-09-17 NOTE — Therapy (Signed)
 OUTPATIENT PHYSICAL THERAPY THORACOLUMBAR TREATMENT/PROGRESS NOTE    Patient Name: Gina Salazar MRN: [id number] DOB:[birth year], 83 y.o., female Today's Date: 09/17/2023   Progress Note Reporting Period 08/19/23 to 09/15/23  See note below for Objective Data and Assessment of Progress/Goals.      END OF SESSION:  PT End of Session - 09/17/23 0931     Visit Number 6    Number of Visits 12    Date for PT Re-Evaluation 10/13/23    Authorization Type [employer] Medicare HMO/PPO    Authorization Time Period No auth    PT Start Time 0933    PT Stop Time 1014    PT Time Calculation (min) 41 min    Activity Tolerance Patient tolerated treatment well    Behavior During Therapy WFL for tasks assessed/performed            Past Medical History:  Diagnosis Date   Depression    High cholesterol    Hypertension    Past Surgical History:  Procedure Laterality Date   CHOLECYSTECTOMY     TONSILLECTOMY     TOTAL HIP ARTHROPLASTY Right    Patient Active Problem List   Diagnosis Date Noted   Urinary incontinence 09/01/2023   Greater trochanteric bursitis of right hip 05/14/2023   Seborrheic keratoses 05/14/2023   Prediabetes 02/02/2023   Need for influenza vaccination 02/02/2023   Pain of left calf 09/02/2022   Anxiety and depression 08/01/2022   Essential hypertension 08/01/2022   Osteoporosis 08/01/2022   Hyperlipidemia 08/01/2022   Asthma due to seasonal allergies 08/01/2022   Obesity (BMI 30-39.9) 08/01/2022   Encounter for general adult medical examination with abnormal findings 08/01/2022   Skin cancer screening 08/01/2022   Need for pneumococcal 20-valent conjugate vaccination 08/01/2022    PCP: Melvenia Manus BRAVO, MD  REFERRING PROVIDER: Margrette Taft BRAVO, MD  REFERRING DIAG: M54.50 (ICD-10-CM) - Lumbar pain M54.10 (ICD-10-CM) - Radicular pain of right lower extremity  Rationale for Evaluation and Treatment: Rehabilitation  THERAPY DIAG:  Other low back  pain  Radicular pain of right lower extremity  Impaired functional mobility, balance, gait, and endurance  Difficulty in walking, not elsewhere classified  ONSET DATE: Chronic LBP over past year, RLE more recent within last few weeks   SUBJECTIVE:                                                                                                                                                                                           SUBJECTIVE STATEMENT: Pt reporting she was very sore last session. Reporting she feels very achy and tired today. 4/10 today. Took pain meds before  today.    Evaluation:  Patient reported she's had back pain for a year or so but RLE pain has occurred more recently. Low back pain feels like an ache. RLE pain just hurts, reports feels like a line of pain pulling down leg. Primary MD thought it to be bursa but orthopedic MD said it was nerve related so they sent her here. Reports standing, and walking as most times when she has her pain. Cooking is irritating. Reports she can stand for about 15 minutes before having to sit at least 5 minutes and then can start moving again. No issue with prolonged sitting or sleeping   PERTINENT HISTORY:  R THA Torn meniscal repair of R knee   PAIN:  Are you having pain? Yes: NPRS scale: 2 in sitting, 6-7/10 w/ STS and walking Pain location: mid Low back and RLE Pain description: Ache and nerve pain Aggravating factors: standing, walking, or anything when she's up on her feet Relieving factors: Rest, extra strength tylenol  PRECAUTIONS: None  RED FLAGS: None   WEIGHT BEARING RESTRICTIONS: No  FALLS:  Has patient fallen in last 6 months? No  LIVING ENVIRONMENT: Lives with: lives with their family Stairs: Yes: External: 1 steps; on right going up Has following equipment at home: Single point cane  OCCUPATION: Retired, Investment banker, corporate   PLOF: Independent  PATIENT GOALS: Would like better balance, and less pain and be  able to go to [employer]  NEXT MD VISIT: no scheduled appointment besides annual visit  OBJECTIVE:  Note: Objective measures were completed at Evaluation unless otherwise noted.  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   No acute abnormalities.  PATIENT SURVEYS:  Modified Oswestry Modified Oswestry Low Back Pain Disability Questionnaire: 17 / 50 = 34.0 %  09/15/23: Modified Oswestry Low Back Pain Disability Questionnaire: 19 / 50 = 38.0 %   COGNITION: Overall cognitive status: Within functional limits for tasks assessed     SENSATION: WFL   POSTURE: rounded shoulders and forward head  PALPATION: Inc discomfort in low back with CPA at L2 level Tenderness w/ palpation to R piriformis area  LUMBAR ROM:   AROM eval 09/15/23  Flexion 75% avail 100%  Extension 50% avail  * in low back  50% avail  Right lateral flexion To knee joint, * in low back   Left lateral flexion To knee joint   Right rotation 75% avail   Left rotation 75% avail    (Blank rows = not tested)   *= painful  *Repetitive lumbar flexion 5 reps unremarkable *Repetitive lumbar extension 5 reps w/ increased discomfort to low back only  LOWER EXTREMITY ROM:     Active  Right eval Left eval  Hip flexion    Hip extension    Hip abduction    Hip adduction    Hip internal rotation    Hip external rotation    Knee flexion    Knee extension    Ankle dorsiflexion    Ankle plantarflexion    Ankle inversion    Ankle eversion     (Blank rows = not tested)  LOWER EXTREMITY MMT:    MMT Right eval Left eval Right 09/15/23 Left 09/15/23  Hip flexion 4- 4 4+ 4+  Hip extension 4- 4 4 4   Hip abduction 4- 4 4+ 4+  Hip adduction      Hip internal rotation      Hip external rotation  Knee flexion 4 4+ 4 4+  Knee extension 4 4+ 4+ 4+  Ankle dorsiflexion 5 5    Ankle plantarflexion      Ankle inversion      Ankle  eversion       (Blank rows = not tested)  LUMBAR SPECIAL TESTS:  Straight leg raise test: Negative and Quadrant test: Positive, pain in low back 09/15/23: Slump test negative  FUNCTIONAL TESTS:  2 minute walk test: 300', pain in back 30 into test, pain in RLE 1 min into test, feels like a burning pain in LE 30 sec chair stand: 11 STS, pain at R hip at 4th STS  09/15/23: 30 sec STS: 14 STS 2 minute Walk Test: 369', half without SPC., R hip pain at end GAIT: Distance walked: 300' Assistive device utilized: Single point cane Level of assistance: Modified independence Comments: Antalgic-like gait, dec velocity, Dec step length bilaterally but more apparent on R, dec hip ext bilaterally, dec stance on R  TREATMENT DATE:  09/17/23:  Recumbent bike, seat 7, 5' Supine: w/ MHP applied   TA contraction, 10 x10  Hamstring curls + TA contraction, feet on green physioball, 2x10  LTR + TA contraction, feet on physioball, 2x10 Seated:  Scapular retraction, 10 holds, 15x  Forward lumbar flexion, emphasis on upright posture, blue physioball, 5x2 ea direction   09/15/23: Progress Note: Modified oswestry Lumbar ROM LE MMT 30 sec chair stand test 2 min walk test Slump test   09/08/23 Supine:  bridge 10X  Piriformis stretch 3X20  Hamstring stretch with strap 3X20  Straight leg raise 10X each  Lower trunk rotations 10X5 each side Sit to stands 10X standard chair no UE Nustep EOS 5 minutes UE/LE level 3   PATIENT EDUCATION:  Education details: PT evaluation, objective findings, POC, Importance of HEP, Precautions, Clinic policies  Person educated: Patient Education method: Explanation and Demonstration Education comprehension: verbalized understanding and returned demonstration  HOME EXERCISE PROGRAM: Access Code: [email or web address] URL: [email or web address] Date: 08/19/2023 Prepared by: Rosaria Powell-Butler  Exercises - Supine Lower Trunk Rotation  - 2 x daily - 7 x  weekly - 2 sets - 10 reps - Hooklying Single Knee to Chest Stretch  - 2 x daily - 7 x weekly - 2 sets - 10 reps - Pelvic Tilt  - 2 x daily - 7 x weekly - 2 sets - 10 reps - 5 hold - Supine Transversus Abdominis Bracing - Hands on Stomach  - 2 x daily - 7 x weekly - 2 sets - 10 reps - 5 hold   Date: 09/08/2023 Prepared by: Greig Fuse - Supine Bridge  - 2 x daily - 7 x weekly - 2 sets - 10 reps - Small Range Straight Leg Raise  - 2 x daily - 7 x weekly - 2 sets - 10 reps - Supine Piriformis Stretch with Foot on Ground  - 2 x daily - 7 x weekly - 2 sets - 3 reps - 30 sec hold - Hooklying Hamstring Stretch with Strap  - 2 x daily - 7 x weekly - 2 sets - 3 reps - 30 sec hold - Sit to Stand  - 2 x daily - 7 x weekly - 2 sets - 5 reps  ASSESSMENT:  CLINICAL IMPRESSION: Patient reporting increased pain/discomfort since last visit. Today's session limited due to this. Focus on core strengthening in supine position with MHP applied throughout. Ended with postural re-training. Pt with good carryover of form throughout. Patient will  benefit from skilled physical therapy in order to address the above to improve function and QOL.    OBJECTIVE IMPAIRMENTS: Abnormal gait, decreased activity tolerance, decreased endurance, decreased mobility, difficulty walking, decreased ROM, decreased strength, improper body mechanics, postural dysfunction, and pain.   ACTIVITY LIMITATIONS: carrying, lifting, bending, standing, squatting, stairs, transfers, and bed mobility  PARTICIPATION LIMITATIONS: meal prep, cleaning, laundry, community activity, and yard work  PERSONAL FACTORS: N/A are also affecting patient's functional outcome.   REHAB POTENTIAL: Good  CLINICAL DECISION MAKING: Stable/uncomplicated  EVALUATION COMPLEXITY: Low   GOALS: Goals reviewed with patient? No  SHORT TERM GOALS: Target date: 09/29/23 Patient will be independent with performance of HEP to demonstrate adequate self management of  symptoms.  Baseline:  Goal status: MET  2.   Patient will report at least a 25% improvement with function or pain overall since beginning PT. Baseline: REPORTS 10% ON 09/15/23 Goal status: IN PROGRESS   LONG TERM GOALS: Target date: 10/07/23 Patient will improve Oswestry score by  10  points in order to improve self-perceived disability and overall function.  Baseline: Goal status: IN PROGRESS   2.  Patient will report 3/10 pain or less with RLE daily pain in order to improve overall daily function.  Baseline: reports moderate to severe with prolonged standing (ie. Cooking) on 09/15/23 Goal status: IN PROGRESS   3.  Patient will improve  30 sec chair stand  test to  at least 16 STS in order to improve LE strength and endurance to improve gait . Baseline: 14 ON 09/15/23 Goal status: REVISED ON 09/15/23   4.  Patient will demonstrate at least 75% avail lumbar ext with reports of 2/10 pain or less to be able to perform overhead functional tasks with ease. Baseline:  Goal status: IN PROGRESS   5.  Patient will report overall 50% improvement since beginning PT. Baseline:  Goal status: IN PROGRESS   PLAN:  PT FREQUENCY: 1-2x/week  PT DURATION: 6 weeks  PLANNED INTERVENTIONS: 97164- PT Re-evaluation, 97110-Therapeutic exercises, 97530- Therapeutic activity, [id number]- Neuromuscular re-education, 97535- Self Care, 02859- Manual therapy, [id number]- Gait training, [phone]- Electrical stimulation (manual), [phone]- Traction (mechanical), Patient/Family education, Balance training, Stair training, Dry Needling, Joint mobilization, Spinal mobilization, and Moist heat.  PLAN FOR NEXT SESSION: Assess balance, slowly re-introduce extension with pt tolerance, core strengthening, RLE strengthening, postural strengthening   10:16 AM, 09/17/23 Rosaria Settler, PT, DPT [facility] [place]

## 2023-09-22 ENCOUNTER — Encounter (HOSPITAL_COMMUNITY): Admitting: Physical Therapy

## 2023-09-24 ENCOUNTER — Ambulatory Visit (HOSPITAL_COMMUNITY)

## 2023-09-24 ENCOUNTER — Encounter (HOSPITAL_COMMUNITY): Payer: Self-pay

## 2023-09-24 DIAGNOSIS — M5410 Radiculopathy, site unspecified: Secondary | ICD-10-CM | POA: Diagnosis not present

## 2023-09-24 DIAGNOSIS — Z7409 Other reduced mobility: Secondary | ICD-10-CM

## 2023-09-24 DIAGNOSIS — M5459 Other low back pain: Secondary | ICD-10-CM | POA: Diagnosis not present

## 2023-09-24 DIAGNOSIS — R2689 Other abnormalities of gait and mobility: Secondary | ICD-10-CM | POA: Diagnosis not present

## 2023-09-24 DIAGNOSIS — R262 Difficulty in walking, not elsewhere classified: Secondary | ICD-10-CM | POA: Diagnosis not present

## 2023-09-24 NOTE — Therapy (Signed)
 OUTPATIENT PHYSICAL THERAPY THORACOLUMBAR TREATMENT   Patient Name: Gina Salazar MRN: [id number] DOB:18-May-1940, 83 y.o., female Today's Date: 09/24/2023      END OF SESSION:  PT End of Session - 09/24/23 1023     Visit Number 7    Number of Visits 12    Date for PT Re-Evaluation 10/13/23    Authorization Type [employer] Medicare HMO/PPO    Authorization Time Period No auth    PT Start Time 1023    PT Stop Time 1100    PT Time Calculation (min) 37 min    Activity Tolerance Patient tolerated treatment well    Behavior During Therapy WFL for tasks assessed/performed            Past Medical History:  Diagnosis Date   Depression    High cholesterol    Hypertension    Past Surgical History:  Procedure Laterality Date   CHOLECYSTECTOMY     TONSILLECTOMY     TOTAL HIP ARTHROPLASTY Right    Patient Active Problem List   Diagnosis Date Noted   Urinary incontinence 09/01/2023   Greater trochanteric bursitis of right hip 05/14/2023   Seborrheic keratoses 05/14/2023   Prediabetes 02/02/2023   Need for influenza vaccination 02/02/2023   Pain of left calf 09/02/2022   Anxiety and depression 08/01/2022   Essential hypertension 08/01/2022   Osteoporosis 08/01/2022   Hyperlipidemia 08/01/2022   Asthma due to seasonal allergies 08/01/2022   Obesity (BMI 30-39.9) 08/01/2022   Encounter for general adult medical examination with abnormal findings 08/01/2022   Skin cancer screening 08/01/2022   Need for pneumococcal 20-valent conjugate vaccination 08/01/2022    PCP: Melvenia Manus BRAVO, MD  REFERRING PROVIDER: Margrette Taft BRAVO, MD  REFERRING DIAG: M54.50 (ICD-10-CM) - Lumbar pain M54.10 (ICD-10-CM) - Radicular pain of right lower extremity  Rationale for Evaluation and Treatment: Rehabilitation  THERAPY DIAG:  Other low back pain  Radicular pain of right lower extremity  Impaired functional mobility, balance, gait, and endurance  ONSET DATE: Chronic LBP over  past year, RLE more recent within last few weeks   SUBJECTIVE:                                                                                                                                                                                           SUBJECTIVE STATEMENT: Pt slightly late to session. Reports she felt okay after last session. Felt pain this morning when she woke up but no pain now. Reports her biggest problem is still standing while cooking, ~20 min is her max.   Evaluation:  Patient reported she's had back pain for a year  or so but RLE pain has occurred more recently. Low back pain feels like an ache. RLE pain just hurts, reports feels like a line of pain pulling down leg. Primary MD thought it to be bursa but orthopedic MD said it was nerve related so they sent her here. Reports standing, and walking as most times when she has her pain. Cooking is irritating. Reports she can stand for about 15 minutes before having to sit at least 5 minutes and then can start moving again. No issue with prolonged sitting or sleeping   PERTINENT HISTORY:  R THA Torn meniscal repair of R knee   PAIN:  Are you having pain? Yes: NPRS scale: 2 in sitting, 6-7/10 w/ STS and walking Pain location: mid Low back and RLE Pain description: Ache and nerve pain Aggravating factors: standing, walking, or anything when she's up on her feet Relieving factors: Rest, extra strength tylenol  PRECAUTIONS: None  RED FLAGS: None   WEIGHT BEARING RESTRICTIONS: No  FALLS:  Has patient fallen in last 6 months? No  LIVING ENVIRONMENT: Lives with: lives with their family Stairs: Yes: External: 1 steps; on right going up Has following equipment at home: Single point cane  OCCUPATION: Retired, Investment banker, corporate   PLOF: Independent  PATIENT GOALS: Would like better balance, and less pain and be able to go to [employer]  NEXT MD VISIT: no scheduled appointment besides annual visit  OBJECTIVE:  Note:  Objective measures were completed at Evaluation unless otherwise noted.  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   No acute abnormalities.  PATIENT SURVEYS:  Modified Oswestry Modified Oswestry Low Back Pain Disability Questionnaire: 17 / 50 = 34.0 %  09/15/23: Modified Oswestry Low Back Pain Disability Questionnaire: 19 / 50 = 38.0 %   COGNITION: Overall cognitive status: Within functional limits for tasks assessed     SENSATION: WFL   POSTURE: rounded shoulders and forward head  PALPATION: Inc discomfort in low back with CPA at L2 level Tenderness w/ palpation to R piriformis area  LUMBAR ROM:   AROM eval 09/15/23  Flexion 75% avail 100%  Extension 50% avail  * in low back  50% avail  Right lateral flexion To knee joint, * in low back   Left lateral flexion To knee joint   Right rotation 75% avail   Left rotation 75% avail    (Blank rows = not tested)   *= painful  *Repetitive lumbar flexion 5 reps unremarkable *Repetitive lumbar extension 5 reps w/ increased discomfort to low back only  LOWER EXTREMITY ROM:     Active  Right eval Left eval  Hip flexion    Hip extension    Hip abduction    Hip adduction    Hip internal rotation    Hip external rotation    Knee flexion    Knee extension    Ankle dorsiflexion    Ankle plantarflexion    Ankle inversion    Ankle eversion     (Blank rows = not tested)  LOWER EXTREMITY MMT:    MMT Right eval Left eval Right 09/15/23 Left 09/15/23  Hip flexion 4- 4 4+ 4+  Hip extension 4- 4 4 4   Hip abduction 4- 4 4+ 4+  Hip adduction      Hip internal rotation      Hip external rotation      Knee flexion 4 4+ 4 4+  Knee extension 4 4+ 4+ 4+  Ankle dorsiflexion 5 5    Ankle plantarflexion      Ankle inversion      Ankle eversion       (Blank rows = not tested)  LUMBAR SPECIAL TESTS:  Straight leg raise test: Negative and Quadrant  test: Positive, pain in low back 09/15/23: Slump test negative  FUNCTIONAL TESTS:  2 minute walk test: 300', pain in back 30 into test, pain in RLE 1 min into test, feels like a burning pain in LE 30 sec chair stand: 11 STS, pain at R hip at 4th STS  09/15/23: 30 sec STS: 14 STS 2 minute Walk Test: 369', half without SPC., R hip pain at end GAIT: Distance walked: 300' Assistive device utilized: Single point cane Level of assistance: Modified independence Comments: Antalgic-like gait, dec velocity, Dec step length bilaterally but more apparent on R, dec hip ext bilaterally, dec stance on R  TREATMENT DATE:  09/24/23:  NuStep, 3 min level 2, seat 7, 2 min level 3 Lumbar extension on parallel bars, 3 holds, 2x10, reproduces LE symptoms Forward lumbar flexion, emphasis on upright posture, blue physioball, 10x ea direction, improves LE uncomfortability Hip extension, in // bars, 10x  10x with 3 lb. AW Lateral Stepping, 20 ftx2, 3 lb. AW Forward Steps ups, 4 inch step, 3 lb. AW, 10x, UE Support when stepping with RLE Lateral step ups, 4 inch step, 3 lb. AW, 10x, UE support   09/17/23:  Recumbent bike, seat 7, 5' Supine: w/ MHP applied   TA contraction, 10 x10  Hamstring curls + TA contraction, feet on green physioball, 2x10  LTR + TA contraction, feet on physioball, 2x10 Seated:  Scapular retraction, 10 holds, 15x  Forward lumbar flexion, emphasis on upright posture, blue physioball, 5x2 ea direction   09/15/23: Progress Note: Modified oswestry Lumbar ROM LE MMT 30 sec chair stand test 2 min walk test Slump test    PATIENT EDUCATION:  Education details: PT evaluation, objective findings, POC, Importance of HEP, Precautions, Clinic policies  Person educated: Patient Education method: Explanation and Demonstration Education comprehension: verbalized understanding and returned demonstration  HOME EXERCISE PROGRAM: Access Code: [email or web address] URL:  [email or web address] Date: 08/19/2023 Prepared by: Rosaria Powell-Butler  Exercises - Supine Lower Trunk Rotation  - 2 x daily - 7 x weekly - 2 sets - 10 reps - Hooklying Single Knee to Chest Stretch  - 2 x daily - 7 x weekly - 2 sets - 10 reps - Pelvic Tilt  - 2 x daily - 7 x weekly - 2 sets - 10 reps - 5 hold - Supine Transversus Abdominis Bracing - Hands on Stomach  - 2 x daily - 7 x weekly - 2 sets - 10 reps - 5 hold   Date: 09/08/2023 Prepared by: Greig Fuse - Supine Bridge  - 2 x daily - 7 x weekly - 2 sets - 10 reps - Small Range Straight Leg Raise  - 2 x daily - 7 x weekly - 2 sets - 10 reps - Supine Piriformis Stretch with Foot on Ground  - 2 x daily - 7 x weekly - 2 sets - 3 reps - 30 sec hold - Hooklying Hamstring Stretch with Strap  - 2 x daily - 7 x weekly - 2 sets - 3 reps - 30 sec hold - Sit to Stand  - 2 x daily - 7 x weekly - 2 sets - 5 reps  ASSESSMENT:  CLINICAL IMPRESSION: Patient tolerated session well. Began with general  warm up on NuStep, reaching level 3. Followed with addressing lumbar extension at parallel bars. Able to reproduce patients familiar pain as when she's cooking. Improved with seated forward lumbar flexion. Added to HEP. Remainder of session spent with LE strengthening. Patient demonstrates some difficulty with forward and lateral step ups with RLE secondary to previous meniscus injury, requiring UE support when competing activities with R. Added to HEP to address LE weakness. Patient will benefit from skilled physical therapy in order to address the above to improve function and QOL.   OBJECTIVE IMPAIRMENTS: Abnormal gait, decreased activity tolerance, decreased endurance, decreased mobility, difficulty walking, decreased ROM, decreased strength, improper body mechanics, postural dysfunction, and pain.   ACTIVITY LIMITATIONS: carrying, lifting, bending, standing, squatting, stairs, transfers, and bed mobility  PARTICIPATION  LIMITATIONS: meal prep, cleaning, laundry, community activity, and yard work  PERSONAL FACTORS: N/A are also affecting patient's functional outcome.   REHAB POTENTIAL: Good  CLINICAL DECISION MAKING: Stable/uncomplicated  EVALUATION COMPLEXITY: Low   GOALS: Goals reviewed with patient? No  SHORT TERM GOALS: Target date: 09/29/23 Patient will be independent with performance of HEP to demonstrate adequate self management of symptoms.  Baseline:  Goal status: MET  2.   Patient will report at least a 25% improvement with function or pain overall since beginning PT. Baseline: REPORTS 10% ON 09/15/23 Goal status: IN PROGRESS   LONG TERM GOALS: Target date: 10/07/23 Patient will improve Oswestry score by  10  points in order to improve self-perceived disability and overall function.  Baseline: Goal status: IN PROGRESS   2.  Patient will report 3/10 pain or less with RLE daily pain in order to improve overall daily function.  Baseline: reports moderate to severe with prolonged standing (ie. Cooking) on 09/15/23 Goal status: IN PROGRESS   3.  Patient will improve  30 sec chair stand  test to  at least 16 STS in order to improve LE strength and endurance to improve gait . Baseline: 14 ON 09/15/23 Goal status: REVISED ON 09/15/23   4.  Patient will demonstrate at least 75% avail lumbar ext with reports of 2/10 pain or less to be able to perform overhead functional tasks with ease. Baseline:  Goal status: IN PROGRESS   5.  Patient will report overall 50% improvement since beginning PT. Baseline:  Goal status: IN PROGRESS   PLAN:  PT FREQUENCY: 1-2x/week  PT DURATION: 6 weeks  PLANNED INTERVENTIONS: 97164- PT Re-evaluation, 97110-Therapeutic exercises, 97530- Therapeutic activity, [id number]- Neuromuscular re-education, 97535- Self Care, 02859- Manual therapy, [id number]- Gait training, [phone]- Electrical stimulation (manual), [phone]- Traction (mechanical), Patient/Family education, Balance  training, Stair training, Dry Needling, Joint mobilization, Spinal mobilization, and Moist heat.  PLAN FOR NEXT SESSION: Assess balance, slowly re-introduce extension with pt tolerance, core strengthening, RLE strengthening, postural strengthening   12:39 PM, 09/24/23 Ousmane Seeman Powell-Butler, PT, DPT [facility] [place]

## 2023-09-26 ENCOUNTER — Other Ambulatory Visit: Payer: Self-pay | Admitting: Internal Medicine

## 2023-09-26 DIAGNOSIS — F32A Depression, unspecified: Secondary | ICD-10-CM

## 2023-09-29 ENCOUNTER — Ambulatory Visit (HOSPITAL_COMMUNITY): Attending: Orthopedic Surgery | Admitting: Physical Therapy

## 2023-09-29 DIAGNOSIS — Z7409 Other reduced mobility: Secondary | ICD-10-CM | POA: Insufficient documentation

## 2023-09-29 DIAGNOSIS — M5459 Other low back pain: Secondary | ICD-10-CM | POA: Insufficient documentation

## 2023-09-29 DIAGNOSIS — M5410 Radiculopathy, site unspecified: Secondary | ICD-10-CM | POA: Diagnosis not present

## 2023-09-29 NOTE — Therapy (Signed)
 OUTPATIENT PHYSICAL THERAPY THORACOLUMBAR TREATMENT   Patient Name: Gina Salazar MRN: [id number] DOB:1940/07/03, 83 y.o., female Today's Date: 09/29/2023      END OF SESSION:  PT End of Session - 09/29/23 1108     Visit Number 8    Number of Visits 12    Date for PT Re-Evaluation 10/13/23    Authorization Type [employer] HMO/PPO    Authorization Time Period No auth    PT Start Time 1020    PT Stop Time 1100    PT Time Calculation (min) 40 min    Activity Tolerance Patient tolerated treatment well    Behavior During Therapy WFL for tasks assessed/performed             Past Medical History:  Diagnosis Date   Depression    High cholesterol    Hypertension    Past Surgical History:  Procedure Laterality Date   CHOLECYSTECTOMY     TONSILLECTOMY     TOTAL HIP ARTHROPLASTY Right    Patient Active Problem List   Diagnosis Date Noted   Urinary incontinence 09/01/2023   Greater trochanteric bursitis of right hip 05/14/2023   Seborrheic keratoses 05/14/2023   Prediabetes 02/02/2023   Need for influenza vaccination 02/02/2023   Pain of left calf 09/02/2022   Anxiety and depression 08/01/2022   Essential hypertension 08/01/2022   Osteoporosis 08/01/2022   Hyperlipidemia 08/01/2022   Asthma due to seasonal allergies 08/01/2022   Obesity (BMI 30-39.9) 08/01/2022   Encounter for general adult medical examination with abnormal findings 08/01/2022   Skin cancer screening 08/01/2022   Need for pneumococcal 20-valent conjugate vaccination 08/01/2022    PCP: Melvenia Manus BRAVO, MD  REFERRING PROVIDER: Margrette Taft BRAVO, MD  REFERRING DIAG: M54.50 (ICD-10-CM) - Lumbar pain M54.10 (ICD-10-CM) - Radicular pain of right lower extremity  Rationale for Evaluation and Treatment: Rehabilitation  THERAPY DIAG:  Other low back pain  Radicular pain of right lower extremity  Impaired functional mobility, balance, gait, and endurance  ONSET DATE: Chronic LBP over  past year, RLE more recent within last few weeks   SUBJECTIVE:                                                                                                                                                                                           SUBJECTIVE STATEMENT: Pt a little sore after last visit, attributes to added step up activities.  Currently with 2/10, mornings are the worst with stiffness in back and hard to get started.    Evaluation:  Patient reported she's had back pain for a year or so but RLE pain has  occurred more recently. Low back pain feels like an ache. RLE pain just hurts, reports feels like a line of pain pulling down leg. Primary MD thought it to be bursa but orthopedic MD said it was nerve related so they sent her here. Reports standing, and walking as most times when she has her pain. Cooking is irritating. Reports she can stand for about 15 minutes before having to sit at least 5 minutes and then can start moving again. No issue with prolonged sitting or sleeping   PERTINENT HISTORY:  R THA Torn meniscal repair of R knee   PAIN:  Are you having pain? Yes: NPRS scale: 2 in sitting, 6-7/10 w/ STS and walking Pain location: mid Low back and RLE Pain description: Ache and nerve pain Aggravating factors: standing, walking, or anything when she's up on her feet Relieving factors: Rest, extra strength tylenol  PRECAUTIONS: None  RED FLAGS: None   WEIGHT BEARING RESTRICTIONS: No  FALLS:  Has patient fallen in last 6 months? No  LIVING ENVIRONMENT: Lives with: lives with their family Stairs: Yes: External: 1 steps; on right going up Has following equipment at home: Single point cane  OCCUPATION: Retired, Investment banker, corporate   PLOF: Independent  PATIENT GOALS: Would like better balance, and less pain and be able to go to [employer]  NEXT MD VISIT: no scheduled appointment besides annual visit  OBJECTIVE:  Note: Objective measures were completed at Evaluation  unless otherwise noted.  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   No acute abnormalities.  PATIENT SURVEYS:  Modified Oswestry Modified Oswestry Low Back Pain Disability Questionnaire: 17 / 50 = 34.0 %  09/15/23: Modified Oswestry Low Back Pain Disability Questionnaire: 19 / 50 = 38.0 %   COGNITION: Overall cognitive status: Within functional limits for tasks assessed     SENSATION: WFL   POSTURE: rounded shoulders and forward head  PALPATION: Inc discomfort in low back with CPA at L2 level Tenderness w/ palpation to R piriformis area  LUMBAR ROM:   AROM eval 09/15/23  Flexion 75% avail 100%  Extension 50% avail  * in low back  50% avail  Right lateral flexion To knee joint, * in low back   Left lateral flexion To knee joint   Right rotation 75% avail   Left rotation 75% avail    (Blank rows = not tested)   *= painful  *Repetitive lumbar flexion 5 reps unremarkable *Repetitive lumbar extension 5 reps w/ increased discomfort to low back only  LOWER EXTREMITY ROM:     Active  Right eval Left eval  Hip flexion    Hip extension    Hip abduction    Hip adduction    Hip internal rotation    Hip external rotation    Knee flexion    Knee extension    Ankle dorsiflexion    Ankle plantarflexion    Ankle inversion    Ankle eversion     (Blank rows = not tested)  LOWER EXTREMITY MMT:    MMT Right eval Left eval Right 09/15/23 Left 09/15/23  Hip flexion 4- 4 4+ 4+  Hip extension 4- 4 4 4   Hip abduction 4- 4 4+ 4+  Hip adduction      Hip internal rotation      Hip external rotation      Knee flexion 4 4+ 4 4+  Knee extension 4 4+ 4+ 4+  Ankle dorsiflexion 5 5  Ankle plantarflexion      Ankle inversion      Ankle eversion       (Blank rows = not tested)  LUMBAR SPECIAL TESTS:  Straight leg raise test: Negative and Quadrant test: Positive, pain in low back 09/15/23:  Slump test negative  FUNCTIONAL TESTS:  2 minute walk test: 300', pain in back 30 into test, pain in RLE 1 min into test, feels like a burning pain in LE 30 sec chair stand: 11 STS, pain at R hip at 4th STS  09/15/23: 30 sec STS: 14 STS 2 minute Walk Test: 369', half without SPC., R hip pain at end GAIT: Distance walked: 300' Assistive device utilized: Single point cane Level of assistance: Modified independence Comments: Antalgic-like gait, dec velocity, Dec step length bilaterally but more apparent on R, dec hip ext bilaterally, dec stance on R  TREATMENT DATE:  09/29/23:  NuStep, seat 7 UE/LE 5 minutes level 3 Standing in bars:  Forward Steps ups, 4 inch step, 10x2, 1 UE Support Lateral step ups, 4 inch step, 1 UE support 10X2 each eccentric lowering Lumbar extension against bar 2X10 Hip extension 3# each LE 2X10 Hip abduction 3# each LE 2X10 Marching 3# each LE 2X10 Lateral side stepping with RTB 20 foot, 2RT  09/24/23:  NuStep, 3 min level 2, seat 7, 2 min level 3 Lumbar extension on parallel bars, 3 holds, 2x10, reproduces LE symptoms Forward lumbar flexion, emphasis on upright posture, blue physioball, 10x ea direction, improves LE uncomfortability Hip extension, in // bars, 10x  10x with 3 lb. AW Lateral Stepping, 20 ftx2, 3 lb. AW Forward Steps ups, 4 inch step, 3 lb. AW, 10x, UE Support when stepping with RLE Lateral step ups, 4 inch step, 3 lb. AW, 10x, UE support   09/17/23:  Recumbent bike, seat 7, 5' Supine: w/ MHP applied   TA contraction, 10 x10  Hamstring curls + TA contraction, feet on green physioball, 2x10  LTR + TA contraction, feet on physioball, 2x10 Seated:  Scapular retraction, 10 holds, 15x  Forward lumbar flexion, emphasis on upright posture, blue physioball, 5x2 ea direction   09/15/23: Progress Note: Modified oswestry Lumbar ROM LE MMT 30 sec chair stand test 2 min walk test Slump test    PATIENT EDUCATION:  Education details: PT  evaluation, objective findings, POC, Importance of HEP, Precautions, Clinic policies  Person educated: Patient Education method: Explanation and Demonstration Education comprehension: verbalized understanding and returned demonstration  HOME EXERCISE PROGRAM: Access Code: [email or web address] URL: [email or web address] Date: 08/19/2023 Prepared by: Rosaria Powell-Butler  Exercises - Supine Lower Trunk Rotation  - 2 x daily - 7 x weekly - 2 sets - 10 reps - Hooklying Single Knee to Chest Stretch  - 2 x daily - 7 x weekly - 2 sets - 10 reps - Pelvic Tilt  - 2 x daily - 7 x weekly - 2 sets - 10 reps - 5 hold - Supine Transversus Abdominis Bracing - Hands on Stomach  - 2 x daily - 7 x weekly - 2 sets - 10 reps - 5 hold   Date: 09/08/2023 Prepared by: Greig Fuse - Supine Bridge  - 2 x daily - 7 x weekly - 2 sets - 10 reps - Small Range Straight Leg Raise  - 2 x daily - 7 x weekly - 2 sets - 10 reps - Supine Piriformis Stretch with Foot on Ground  - 2 x daily - 7 x weekly - 2 sets - 3  reps - 30 sec hold - Hooklying Hamstring Stretch with Strap  - 2 x daily - 7 x weekly - 2 sets - 3 reps - 30 sec hold - Sit to Stand  - 2 x daily - 7 x weekly - 2 sets - 5 reps  ASSESSMENT:  CLINICAL IMPRESSION: Continued with focus on improving LE strength and stability.  Added forward lunge with ability to complete without UE assist.  Overall, patient tolerated session well.  Less issues with step ups today with Rt LE  (previous meniscus injury) with ability to complete all other exercises with only 1 UE assist. Pt required 3 short seated rest breaks during session today due to fatigue.  Cues to complete therex more slowly and controlled. Patient will benefit from skilled physical therapy in order to address the above to improve function and QOL.   OBJECTIVE IMPAIRMENTS: Abnormal gait, decreased activity tolerance, decreased endurance, decreased mobility, difficulty walking, decreased ROM, decreased  strength, improper body mechanics, postural dysfunction, and pain.   ACTIVITY LIMITATIONS: carrying, lifting, bending, standing, squatting, stairs, transfers, and bed mobility  PARTICIPATION LIMITATIONS: meal prep, cleaning, laundry, community activity, and yard work  PERSONAL FACTORS: N/A are also affecting patient's functional outcome.   REHAB POTENTIAL: Good  CLINICAL DECISION MAKING: Stable/uncomplicated  EVALUATION COMPLEXITY: Low   GOALS: Goals reviewed with patient? No  SHORT TERM GOALS: Target date: 09/29/23 Patient will be independent with performance of HEP to demonstrate adequate self management of symptoms.  Baseline:  Goal status: MET  2.   Patient will report at least a 25% improvement with function or pain overall since beginning PT. Baseline: REPORTS 10% ON 09/15/23 Goal status: IN PROGRESS   LONG TERM GOALS: Target date: 10/07/23 Patient will improve Oswestry score by  10  points in order to improve self-perceived disability and overall function.  Baseline: Goal status: IN PROGRESS   2.  Patient will report 3/10 pain or less with RLE daily pain in order to improve overall daily function.  Baseline: reports moderate to severe with prolonged standing (ie. Cooking) on 09/15/23 Goal status: IN PROGRESS   3.  Patient will improve  30 sec chair stand  test to  at least 16 STS in order to improve LE strength and endurance to improve gait . Baseline: 14 ON 09/15/23 Goal status: REVISED ON 09/15/23   4.  Patient will demonstrate at least 75% avail lumbar ext with reports of 2/10 pain or less to be able to perform overhead functional tasks with ease. Baseline:  Goal status: IN PROGRESS   5.  Patient will report overall 50% improvement since beginning PT. Baseline:  Goal status: IN PROGRESS   PLAN:  PT FREQUENCY: 1-2x/week  PT DURATION: 6 weeks  PLANNED INTERVENTIONS: 97164- PT Re-evaluation, 97110-Therapeutic exercises, 97530- Therapeutic activity, [id number]-  Neuromuscular re-education, 97535- Self Care, 02859- Manual therapy, [id number]- Gait training, [phone]- Electrical stimulation (manual), [phone]- Traction (mechanical), Patient/Family education, Balance training, Stair training, Dry Needling, Joint mobilization, Spinal mobilization, and Moist heat.  PLAN FOR NEXT SESSION: continue with focus on core strengthening, RLE strengthening, postural strengthening   11:08 AM, 09/29/23 Greig KATHEE Fuse, PTA/CLT [facility] Ph: [phone]

## 2023-10-01 ENCOUNTER — Ambulatory Visit (HOSPITAL_COMMUNITY)

## 2023-10-01 ENCOUNTER — Encounter (HOSPITAL_COMMUNITY): Payer: Self-pay

## 2023-10-01 DIAGNOSIS — M5410 Radiculopathy, site unspecified: Secondary | ICD-10-CM

## 2023-10-01 DIAGNOSIS — M5459 Other low back pain: Secondary | ICD-10-CM

## 2023-10-01 DIAGNOSIS — Z7409 Other reduced mobility: Secondary | ICD-10-CM | POA: Diagnosis not present

## 2023-10-01 NOTE — Therapy (Signed)
 OUTPATIENT PHYSICAL THERAPY THORACOLUMBAR TREATMENT   Patient Name: Gina Salazar MRN: [id number] DOB:04-28-1940, 83 y.o., female Today's Date: 10/01/2023      END OF SESSION:  PT End of Session - 10/01/23 1151     Visit Number 9    Number of Visits 12    Date for PT Re-Evaluation 10/13/23    Authorization Type [employer] Medicare HMO/PPO    Authorization Time Period No auth    PT Start Time 1152    PT Stop Time 1230    PT Time Calculation (min) 38 min    Activity Tolerance Patient tolerated treatment well    Behavior During Therapy WFL for tasks assessed/performed             Past Medical History:  Diagnosis Date   Depression    High cholesterol    Hypertension    Past Surgical History:  Procedure Laterality Date   CHOLECYSTECTOMY     TONSILLECTOMY     TOTAL HIP ARTHROPLASTY Right    Patient Active Problem List   Diagnosis Date Noted   Urinary incontinence 09/01/2023   Greater trochanteric bursitis of right hip 05/14/2023   Seborrheic keratoses 05/14/2023   Prediabetes 02/02/2023   Need for influenza vaccination 02/02/2023   Pain of left calf 09/02/2022   Anxiety and depression 08/01/2022   Essential hypertension 08/01/2022   Osteoporosis 08/01/2022   Hyperlipidemia 08/01/2022   Asthma due to seasonal allergies 08/01/2022   Obesity (BMI 30-39.9) 08/01/2022   Encounter for general adult medical examination with abnormal findings 08/01/2022   Skin cancer screening 08/01/2022   Need for pneumococcal 20-valent conjugate vaccination 08/01/2022    PCP: Melvenia Manus BRAVO, MD  REFERRING PROVIDER: Margrette Taft BRAVO, MD  REFERRING DIAG: M54.50 (ICD-10-CM) - Lumbar pain M54.10 (ICD-10-CM) - Radicular pain of right lower extremity  Rationale for Evaluation and Treatment: Rehabilitation  THERAPY DIAG:  Other low back pain  Radicular pain of right lower extremity  Impaired functional mobility, balance, gait, and endurance  ONSET DATE: Chronic LBP over  past year, RLE more recent within last few weeks   SUBJECTIVE:                                                                                                                                                                                           SUBJECTIVE STATEMENT: Pt reports back feels fine, R hip not bad either. Reports she has not done any cooking so she isn't hurting as much. HEP going well.    Evaluation:  Patient reported she's had back pain for a year or so but RLE pain has occurred more recently. Low  back pain feels like an ache. RLE pain just hurts, reports feels like a line of pain pulling down leg. Primary MD thought it to be bursa but orthopedic MD said it was nerve related so they sent her here. Reports standing, and walking as most times when she has her pain. Cooking is irritating. Reports she can stand for about 15 minutes before having to sit at least 5 minutes and then can start moving again. No issue with prolonged sitting or sleeping   PERTINENT HISTORY:  R THA Torn meniscal repair of R knee   PAIN:  Are you having pain? Yes: NPRS scale: 2 in sitting, 6-7/10 w/ STS and walking Pain location: mid Low back and RLE Pain description: Ache and nerve pain Aggravating factors: standing, walking, or anything when she's up on her feet Relieving factors: Rest, extra strength tylenol  PRECAUTIONS: None  RED FLAGS: None   WEIGHT BEARING RESTRICTIONS: No  FALLS:  Has patient fallen in last 6 months? No  LIVING ENVIRONMENT: Lives with: lives with their family Stairs: Yes: External: 1 steps; on right going up Has following equipment at home: Single point cane  OCCUPATION: Retired, Investment banker, corporate   PLOF: Independent  PATIENT GOALS: Would like better balance, and less pain and be able to go to [employer]  NEXT MD VISIT: no scheduled appointment besides annual visit  OBJECTIVE:  Note: Objective measures were completed at Evaluation unless otherwise  noted.  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   No acute abnormalities.  PATIENT SURVEYS:  Modified Oswestry Modified Oswestry Low Back Pain Disability Questionnaire: 17 / 50 = 34.0 %  09/15/23: Modified Oswestry Low Back Pain Disability Questionnaire: 19 / 50 = 38.0 %   COGNITION: Overall cognitive status: Within functional limits for tasks assessed     SENSATION: WFL   POSTURE: rounded shoulders and forward head  PALPATION: Inc discomfort in low back with CPA at L2 level Tenderness w/ palpation to R piriformis area  LUMBAR ROM:   AROM eval 09/15/23  Flexion 75% avail 100%  Extension 50% avail  * in low back  50% avail  Right lateral flexion To knee joint, * in low back   Left lateral flexion To knee joint   Right rotation 75% avail   Left rotation 75% avail    (Blank rows = not tested)   *= painful  *Repetitive lumbar flexion 5 reps unremarkable *Repetitive lumbar extension 5 reps w/ increased discomfort to low back only  LOWER EXTREMITY ROM:     Active  Right eval Left eval  Hip flexion    Hip extension    Hip abduction    Hip adduction    Hip internal rotation    Hip external rotation    Knee flexion    Knee extension    Ankle dorsiflexion    Ankle plantarflexion    Ankle inversion    Ankle eversion     (Blank rows = not tested)  LOWER EXTREMITY MMT:    MMT Right eval Left eval Right 09/15/23 Left 09/15/23  Hip flexion 4- 4 4+ 4+  Hip extension 4- 4 4 4   Hip abduction 4- 4 4+ 4+  Hip adduction      Hip internal rotation      Hip external rotation      Knee flexion 4 4+ 4 4+  Knee extension 4 4+ 4+ 4+  Ankle dorsiflexion 5 5    Ankle plantarflexion  Ankle inversion      Ankle eversion       (Blank rows = not tested)  LUMBAR SPECIAL TESTS:  Straight leg raise test: Negative and Quadrant test: Positive, pain in low back 09/15/23: Slump test  negative  FUNCTIONAL TESTS:  2 minute walk test: 300', pain in back 30 into test, pain in RLE 1 min into test, feels like a burning pain in LE 30 sec chair stand: 11 STS, pain at R hip at 4th STS  09/15/23: 30 sec STS: 14 STS 2 minute Walk Test: 369', half without SPC., R hip pain at end GAIT: Distance walked: 300' Assistive device utilized: Single point cane Level of assistance: Modified independence Comments: Antalgic-like gait, dec velocity, Dec step length bilaterally but more apparent on R, dec hip ext bilaterally, dec stance on R  TREATMENT DATE:  10/01/23: NuStep, seat 7, 5', level 1 Heel raises on incline, 2x10 Toe Raises on decline, 2x10 Step up + march on 4 inch step with opp UE lift, 10x each LE Side step on aeromat, in // bars, 10 ft x6 Shoulder rows + TA contraction, RTB, 12x Shoulder ext + TA contraction, RTB, 12x Paloff press + TA contraction, RTB, 12x  09/29/23:  NuStep, seat 7 UE/LE 5 minutes level 3 Standing in bars:  Forward Steps ups, 4 inch step, 10x2, 1 UE Support Lateral step ups, 4 inch step, 1 UE support 10X2 each eccentric lowering Lumbar extension against bar 2X10 Hip extension 3# each LE 2X10 Hip abduction 3# each LE 2X10 Marching 3# each LE 2X10 Lateral side stepping with RTB 20 foot, 2RT  09/24/23:  NuStep, 3 min level 2, seat 7, 2 min level 3 Lumbar extension on parallel bars, 3 holds, 2x10, reproduces LE symptoms Forward lumbar flexion, emphasis on upright posture, blue physioball, 10x ea direction, improves LE uncomfortability Hip extension, in // bars, 10x  10x with 3 lb. AW Lateral Stepping, 20 ftx2, 3 lb. AW Forward Steps ups, 4 inch step, 3 lb. AW, 10x, UE Support when stepping with RLE Lateral step ups, 4 inch step, 3 lb. AW, 10x, UE support    PATIENT EDUCATION:  Education details: PT evaluation, objective findings, POC, Importance of HEP, Precautions, Clinic policies  Person educated: Patient Education method: Explanation and  Demonstration Education comprehension: verbalized understanding and returned demonstration  HOME EXERCISE PROGRAM: Access Code: [email or web address] URL: [email or web address] Date: 08/19/2023 Prepared by: Rosaria Powell-Butler  Exercises - Supine Lower Trunk Rotation  - 2 x daily - 7 x weekly - 2 sets - 10 reps - Hooklying Single Knee to Chest Stretch  - 2 x daily - 7 x weekly - 2 sets - 10 reps - Pelvic Tilt  - 2 x daily - 7 x weekly - 2 sets - 10 reps - 5 hold - Supine Transversus Abdominis Bracing - Hands on Stomach  - 2 x daily - 7 x weekly - 2 sets - 10 reps - 5 hold   Date: 09/08/2023 Prepared by: Greig Fuse - Supine Bridge  - 2 x daily - 7 x weekly - 2 sets - 10 reps - Small Range Straight Leg Raise  - 2 x daily - 7 x weekly - 2 sets - 10 reps - Supine Piriformis Stretch with Foot on Ground  - 2 x daily - 7 x weekly - 2 sets - 3 reps - 30 sec hold - Hooklying Hamstring Stretch with Strap  - 2 x daily - 7 x weekly - 2 sets -  3 reps - 30 sec hold - Sit to Stand  - 2 x daily - 7 x weekly - 2 sets - 5 reps  ASSESSMENT:  CLINICAL IMPRESSION: Patient tolerated session well. Began on NuStep for general warm up. Followed with LE strengthening and balance. Patient demo moderate imbalance during step up with march and side stepping on foam surface, requiring min A/CGA and UE support intermittently throughout. Ended session with postural re-education and core strengthening during shoulder rows, extension, and Paloff press. Patient demo good carryover of form throughout. Patient will benefit from skilled physical therapy in order to address the above to improve function and QOL.    OBJECTIVE IMPAIRMENTS: Abnormal gait, decreased activity tolerance, decreased endurance, decreased mobility, difficulty walking, decreased ROM, decreased strength, improper body mechanics, postural dysfunction, and pain.   ACTIVITY LIMITATIONS: carrying, lifting, bending, standing, squatting, stairs,  transfers, and bed mobility  PARTICIPATION LIMITATIONS: meal prep, cleaning, laundry, community activity, and yard work  PERSONAL FACTORS: N/A are also affecting patient's functional outcome.   REHAB POTENTIAL: Good  CLINICAL DECISION MAKING: Stable/uncomplicated  EVALUATION COMPLEXITY: Low   GOALS: Goals reviewed with patient? No  SHORT TERM GOALS: Target date: 09/29/23 Patient will be independent with performance of HEP to demonstrate adequate self management of symptoms.  Baseline:  Goal status: MET  2.   Patient will report at least a 25% improvement with function or pain overall since beginning PT. Baseline: REPORTS 10% ON 09/15/23 Goal status: IN PROGRESS   LONG TERM GOALS: Target date: 10/07/23 Patient will improve Oswestry score by  10  points in order to improve self-perceived disability and overall function.  Baseline: Goal status: IN PROGRESS   2.  Patient will report 3/10 pain or less with RLE daily pain in order to improve overall daily function.  Baseline: reports moderate to severe with prolonged standing (ie. Cooking) on 09/15/23 Goal status: IN PROGRESS   3.  Patient will improve  30 sec chair stand  test to  at least 16 STS in order to improve LE strength and endurance to improve gait . Baseline: 14 ON 09/15/23 Goal status: REVISED ON 09/15/23   4.  Patient will demonstrate at least 75% avail lumbar ext with reports of 2/10 pain or less to be able to perform overhead functional tasks with ease. Baseline:  Goal status: IN PROGRESS   5.  Patient will report overall 50% improvement since beginning PT. Baseline:  Goal status: IN PROGRESS   PLAN:  PT FREQUENCY: 1-2x/week  PT DURATION: 6 weeks  PLANNED INTERVENTIONS: 97164- PT Re-evaluation, 97110-Therapeutic exercises, 97530- Therapeutic activity, [id number]- Neuromuscular re-education, 97535- Self Care, 02859- Manual therapy, [id number]- Gait training, [phone]- Electrical stimulation (manual), [phone]- Traction  (mechanical), Patient/Family education, Balance training, Stair training, Dry Needling, Joint mobilization, Spinal mobilization, and Moist heat.  PLAN FOR NEXT SESSION: continue with focus on core strengthening, RLE strengthening, postural strengthening, discuss discharge or extension next session   12:33 PM, 10/01/23 Rosaria Settler, PT, DPT [facility] [place]

## 2023-10-07 ENCOUNTER — Encounter (HOSPITAL_COMMUNITY): Payer: Self-pay

## 2023-10-07 ENCOUNTER — Ambulatory Visit (HOSPITAL_COMMUNITY)

## 2023-10-07 DIAGNOSIS — Z7409 Other reduced mobility: Secondary | ICD-10-CM | POA: Diagnosis not present

## 2023-10-07 DIAGNOSIS — M5410 Radiculopathy, site unspecified: Secondary | ICD-10-CM

## 2023-10-07 DIAGNOSIS — M5459 Other low back pain: Secondary | ICD-10-CM | POA: Diagnosis not present

## 2023-10-07 NOTE — Therapy (Signed)
 OUTPATIENT PHYSICAL THERAPY THORACOLUMBAR TREATMENT   Patient Name: Sol Odor MRN: [id number] DOB:09/18/1940, 83 y.o., female Today's Date: 10/07/2023      END OF SESSION:  PT End of Session - 10/07/23 1109     Visit Number 10    Number of Visits 12    Date for PT Re-Evaluation 10/13/23    Authorization Type [employer] HMO/PPO    Authorization Time Period No auth    PT Start Time 1106    PT Stop Time 1145    PT Time Calculation (min) 39 min    Activity Tolerance Patient tolerated treatment well    Behavior During Therapy WFL for tasks assessed/performed            Past Medical History:  Diagnosis Date   Depression    High cholesterol    Hypertension    Past Surgical History:  Procedure Laterality Date   CHOLECYSTECTOMY     TONSILLECTOMY     TOTAL HIP ARTHROPLASTY Right    Patient Active Problem List   Diagnosis Date Noted   Urinary incontinence 09/01/2023   Greater trochanteric bursitis of right hip 05/14/2023   Seborrheic keratoses 05/14/2023   Prediabetes 02/02/2023   Need for influenza vaccination 02/02/2023   Pain of left calf 09/02/2022   Anxiety and depression 08/01/2022   Essential hypertension 08/01/2022   Osteoporosis 08/01/2022   Hyperlipidemia 08/01/2022   Asthma due to seasonal allergies 08/01/2022   Obesity (BMI 30-39.9) 08/01/2022   Encounter for general adult medical examination with abnormal findings 08/01/2022   Skin cancer screening 08/01/2022   Need for pneumococcal 20-valent conjugate vaccination 08/01/2022    PCP: Melvenia Manus BRAVO, MD  REFERRING PROVIDER: Margrette Taft BRAVO, MD  REFERRING DIAG: M54.50 (ICD-10-CM) - Lumbar pain M54.10 (ICD-10-CM) - Radicular pain of right lower extremity  Rationale for Evaluation and Treatment: Rehabilitation  THERAPY DIAG:  Other low back pain  Radicular pain of right lower extremity  Impaired functional mobility, balance, gait, and endurance  ONSET DATE: Chronic LBP over  past year, RLE more recent within last few weeks   SUBJECTIVE:                                                                                                                                                                                           SUBJECTIVE STATEMENT: Reports she feels good today. Reports she tried to do some stretches before getting out of bed and that helped with pain.    Evaluation:  Patient reported she's had back pain for a year or so but RLE pain has occurred more recently. Low back pain feels like an  ache. RLE pain just hurts, reports feels like a line of pain pulling down leg. Primary MD thought it to be bursa but orthopedic MD said it was nerve related so they sent her here. Reports standing, and walking as most times when she has her pain. Cooking is irritating. Reports she can stand for about 15 minutes before having to sit at least 5 minutes and then can start moving again. No issue with prolonged sitting or sleeping   PERTINENT HISTORY:  R THA Torn meniscal repair of R knee   PAIN:  Are you having pain? Yes: NPRS scale: 2 in sitting, 6-7/10 w/ STS and walking Pain location: mid Low back and RLE Pain description: Ache and nerve pain Aggravating factors: standing, walking, or anything when she's up on her feet Relieving factors: Rest, extra strength tylenol  PRECAUTIONS: None  RED FLAGS: None   WEIGHT BEARING RESTRICTIONS: No  FALLS:  Has patient fallen in last 6 months? No  LIVING ENVIRONMENT: Lives with: lives with their family Stairs: Yes: External: 1 steps; on right going up Has following equipment at home: Single point cane  OCCUPATION: Retired, Investment banker, corporate   PLOF: Independent  PATIENT GOALS: Would like better balance, and less pain and be able to go to [employer]  NEXT MD VISIT: no scheduled appointment besides annual visit  OBJECTIVE:  Note: Objective measures were completed at Evaluation unless otherwise noted.  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   No acute abnormalities.  PATIENT SURVEYS:  Modified Oswestry Modified Oswestry Low Back Pain Disability Questionnaire: 17 / 50 = 34.0 %  09/15/23: Modified Oswestry Low Back Pain Disability Questionnaire: 19 / 50 = 38.0 %   COGNITION: Overall cognitive status: Within functional limits for tasks assessed     SENSATION: WFL   POSTURE: rounded shoulders and forward head  PALPATION: Inc discomfort in low back with CPA at L2 level Tenderness w/ palpation to R piriformis area  LUMBAR ROM:   AROM eval 09/15/23  Flexion 75% avail 100%  Extension 50% avail  * in low back  50% avail  Right lateral flexion To knee joint, * in low back   Left lateral flexion To knee joint   Right rotation 75% avail   Left rotation 75% avail    (Blank rows = not tested)   *= painful  *Repetitive lumbar flexion 5 reps unremarkable *Repetitive lumbar extension 5 reps w/ increased discomfort to low back only  LOWER EXTREMITY ROM:     Active  Right eval Left eval  Hip flexion    Hip extension    Hip abduction    Hip adduction    Hip internal rotation    Hip external rotation    Knee flexion    Knee extension    Ankle dorsiflexion    Ankle plantarflexion    Ankle inversion    Ankle eversion     (Blank rows = not tested)  LOWER EXTREMITY MMT:    MMT Right eval Left eval Right 09/15/23 Left 09/15/23  Hip flexion 4- 4 4+ 4+  Hip extension 4- 4 4 4   Hip abduction 4- 4 4+ 4+  Hip adduction      Hip internal rotation      Hip external rotation      Knee flexion 4 4+ 4 4+  Knee extension 4 4+ 4+ 4+  Ankle dorsiflexion 5 5    Ankle plantarflexion      Ankle  inversion      Ankle eversion       (Blank rows = not tested)  LUMBAR SPECIAL TESTS:  Straight leg raise test: Negative and Quadrant test: Positive, pain in low back 09/15/23: Slump test negative  FUNCTIONAL TESTS:   2 minute walk test: 300', pain in back 30 into test, pain in RLE 1 min into test, feels like a burning pain in LE 30 sec chair stand: 11 STS, pain at R hip at 4th STS  09/15/23: 30 sec STS: 14 STS 2 minute Walk Test: 369', half without SPC., R hip pain at end GAIT: Distance walked: 300' Assistive device utilized: Single point cane Level of assistance: Modified independence Comments: Antalgic-like gait, dec velocity, Dec step length bilaterally but more apparent on R, dec hip ext bilaterally, dec stance on R  TREATMENT DATE:  10/07/23: NuStep, seat 8, 5' Shoulder rows + TA contraction, GTB, 15x Shoulder ext + TA contraction, GTB, 15x Paloff press + TA contraction, GTB, 15x RDL, 5 lb., 15x  10 lb., 15x, reports increased low back discomfort Seated Forward lumbar flexion, 10x each direction  Hip extension, 3 lb. AW, 15x Standing Marching, 3 lb. AW donned, aiming for UE holding 2 lb weighted bar, 2x10, reports increased low back discomfort  10/01/23: NuStep, seat 7, 5', level 1 Heel raises on incline, 2x10 Toe Raises on decline, 2x10 Step up + march on 4 inch step with opp UE lift, 10x each LE Side step on aeromat, in // bars, 10 ft x6 Shoulder rows + TA contraction, RTB, 12x Shoulder ext + TA contraction, RTB, 12x Paloff press + TA contraction, RTB, 12x  09/29/23:  NuStep, seat 7 UE/LE 5 minutes level 3 Standing in bars:  Forward Steps ups, 4 inch step, 10x2, 1 UE Support Lateral step ups, 4 inch step, 1 UE support 10X2 each eccentric lowering Lumbar extension against bar 2X10 Hip extension 3# each LE 2X10 Hip abduction 3# each LE 2X10 Marching 3# each LE 2X10 Lateral side stepping with RTB 20 foot, 2RT   PATIENT EDUCATION:  Education details: PT evaluation, objective findings, POC, Importance of HEP, Precautions, Clinic policies  Person educated: Patient Education method: Explanation and Demonstration Education comprehension: verbalized understanding and returned  demonstration  HOME EXERCISE PROGRAM: Access Code: [email or web address] URL: [email or web address] Date: 08/19/2023 Prepared by: Rosaria Powell-Butler  Exercises - Supine Lower Trunk Rotation  - 2 x daily - 7 x weekly - 2 sets - 10 reps - Hooklying Single Knee to Chest Stretch  - 2 x daily - 7 x weekly - 2 sets - 10 reps - Pelvic Tilt  - 2 x daily - 7 x weekly - 2 sets - 10 reps - 5 hold - Supine Transversus Abdominis Bracing - Hands on Stomach  - 2 x daily - 7 x weekly - 2 sets - 10 reps - 5 hold   Date: 09/08/2023 Prepared by: Greig Fuse - Supine Bridge  - 2 x daily - 7 x weekly - 2 sets - 10 reps - Small Range Straight Leg Raise  - 2 x daily - 7 x weekly - 2 sets - 10 reps - Supine Piriformis Stretch with Foot on Ground  - 2 x daily - 7 x weekly - 2 sets - 3 reps - 30 sec hold - Hooklying Hamstring Stretch with Strap  - 2 x daily - 7 x weekly - 2 sets - 3 reps - 30 sec hold - Sit to Stand  - 2 x daily -  7 x weekly - 2 sets - 5 reps  ASSESSMENT:  CLINICAL IMPRESSION: Today's session focused on postural retraining, LE strengthening with functional activity and balance towards end of session. Patient demo good carryover with scapular retraction exercises but required some verbal reminders for paloff press. Added to HEP. Patient required verbal cueing for form throughout dead lifts. Reported increased low back discomfort towards end of trial that improved with forward lumbar flexion.  Attempted to complete weighted forward marching carrying UE weight, pt unable to march forward consistently due to mild-mod imbalance, CGA required. Completes standing marching in place with mild imbalance at times but overall much better than with forward marching. Reports mild LBP afterwards that improves with rest. Patient will benefit from skilled physical therapy in order to address the above to improve function and QOL.   OBJECTIVE IMPAIRMENTS: Abnormal gait, decreased activity tolerance, decreased  endurance, decreased mobility, difficulty walking, decreased ROM, decreased strength, improper body mechanics, postural dysfunction, and pain.   ACTIVITY LIMITATIONS: carrying, lifting, bending, standing, squatting, stairs, transfers, and bed mobility  PARTICIPATION LIMITATIONS: meal prep, cleaning, laundry, community activity, and yard work  PERSONAL FACTORS: N/A are also affecting patient's functional outcome.   REHAB POTENTIAL: Good  CLINICAL DECISION MAKING: Stable/uncomplicated  EVALUATION COMPLEXITY: Low   GOALS: Goals reviewed with patient? No  SHORT TERM GOALS: Target date: 09/29/23 Patient will be independent with performance of HEP to demonstrate adequate self management of symptoms.  Baseline:  Goal status: MET  2.   Patient will report at least a 25% improvement with function or pain overall since beginning PT. Baseline: REPORTS 10% ON 09/15/23 Goal status: IN PROGRESS   LONG TERM GOALS: Target date: 10/07/23 Patient will improve Oswestry score by  10  points in order to improve self-perceived disability and overall function.  Baseline: Goal status: IN PROGRESS   2.  Patient will report 3/10 pain or less with RLE daily pain in order to improve overall daily function.  Baseline: reports moderate to severe with prolonged standing (ie. Cooking) on 09/15/23 Goal status: IN PROGRESS   3.  Patient will improve  30 sec chair stand  test to  at least 16 STS in order to improve LE strength and endurance to improve gait . Baseline: 14 ON 09/15/23 Goal status: REVISED ON 09/15/23   4.  Patient will demonstrate at least 75% avail lumbar ext with reports of 2/10 pain or less to be able to perform overhead functional tasks with ease. Baseline:  Goal status: IN PROGRESS   5.  Patient will report overall 50% improvement since beginning PT. Baseline:  Goal status: IN PROGRESS   PLAN:  PT FREQUENCY: 1-2x/week  PT DURATION: 6 weeks  PLANNED INTERVENTIONS: 97164- PT  Re-evaluation, 97110-Therapeutic exercises, 97530- Therapeutic activity, [id number]- Neuromuscular re-education, 97535- Self Care, [id number]- Manual therapy, [id number]- Gait training, [phone]- Electrical stimulation (manual), [id number]- Traction (mechanical), Patient/Family education, Balance training, Stair training, Dry Needling, Joint mobilization, Spinal mobilization, and Moist heat.  PLAN FOR NEXT SESSION: continue with focus on core strengthening, RLE strengthening, postural strengthening, balance   12:38 PM, 10/07/23 Rosaria Settler, PT, DPT [facility] [place]

## 2023-10-12 ENCOUNTER — Encounter (HOSPITAL_COMMUNITY)

## 2023-10-29 ENCOUNTER — Encounter (HOSPITAL_COMMUNITY)

## 2023-10-31 ENCOUNTER — Other Ambulatory Visit: Payer: Self-pay

## 2023-10-31 DIAGNOSIS — F32A Depression, unspecified: Secondary | ICD-10-CM

## 2023-11-03 ENCOUNTER — Ambulatory Visit

## 2023-11-03 VITALS — Ht 62.5 in | Wt 206.0 lb

## 2023-11-03 DIAGNOSIS — Z780 Asymptomatic menopausal state: Secondary | ICD-10-CM

## 2023-11-03 DIAGNOSIS — Z1231 Encounter for screening mammogram for malignant neoplasm of breast: Secondary | ICD-10-CM

## 2023-11-03 DIAGNOSIS — Z0000 Encounter for general adult medical examination without abnormal findings: Secondary | ICD-10-CM

## 2023-11-03 NOTE — Patient Instructions (Signed)
 Gina Salazar , Thank you for taking time out of your busy schedule to complete your Annual Wellness Visit with me. I enjoyed our conversation and look forward to speaking with you again next year. I, as well as your care team,  appreciate your ongoing commitment to your health goals. Please review the following plan we discussed and let me know if I can assist you in the future. Your Game plan/ To Do List    Referrals: If you haven't heard from the office you've been referred to, please reach out to them at the phone provided.  Osteoporosis Screening/Yearly Mammogram: Please call the number below to schedule your appt. [facility] Phone: [phone]  Follow up Visits: We will see or speak with you next year for your Next Medicare AWV with our clinical staff  Clinician Recommendations:  Aim for 30 minutes of exercise or brisk walking, 6-8 glasses of water, and 5 servings of fruits and vegetables each day.    Wishing you many blessings and good health during the next year until our next visit.  -[name]   This is a list of the screenings recommended for you:  Health Maintenance  Topic Date Due   COVID-19 Vaccine (1) Never done   DTaP/Tdap/Td vaccine (1 - Tdap) Never done   Zoster (Shingles) Vaccine (1 of 2) Never done   Mammogram  04/17/2022   Flu Shot  10/30/2023   DEXA scan (bone density measurement)  11/26/2023   Medicare Annual Wellness Visit  11/02/2024   Pneumococcal Vaccine for age over 48  Completed   Hepatitis B Vaccine  Aged Out   HPV Vaccine  Aged Out   Meningitis B Vaccine  Aged Out   Hepatitis C Screening  Discontinued    Advanced directives: (Declined) Advance directive discussed with you today. Even though you declined this today, please call our office should you change your mind, and we can give you the proper paperwork for you to fill out. Advance Care Planning is important because it:  [x]  Makes sure you receive the medical care that is  consistent with your values, goals, and preferences  [x]  It provides guidance to your family and loved ones and reduces their decisional burden about whether or not they are making the right decisions based on your wishes.  Follow the link provided in your after visit summary or read over the paperwork we have mailed to you to help you started getting your Advance Directives in place. If you need assistance in completing these, please reach out to us  so that we can help you!  See attachments for Preventive Care and Fall Prevention Tips.

## 2023-11-03 NOTE — Progress Notes (Signed)
 Subjective:   Gina Salazar is a 83 y.o. who presents for a Medicare Wellness preventive visit.  As a reminder, Annual Wellness Visits don't include a physical exam, and some assessments may be limited, especially if this visit is performed virtually. We may recommend an in-person follow-up visit with your provider if needed.  Visit Complete: Virtual I connected with  [name] on 11/03/23 by a audio enabled telemedicine application and verified that I am speaking with the correct person using two identifiers.  Patient Location: Home  Provider Location: Home Office  I discussed the limitations of evaluation and management by telemedicine. The patient expressed understanding and agreed to proceed.  Vital Signs: Because this visit was a virtual/telehealth visit, some criteria may be missing or patient reported. Any vitals not documented were not able to be obtained and vitals that have been documented are patient reported.  VideoDeclined- This patient declined Librarian, academic. Therefore the visit was completed with audio only.  Persons Participating in Visit: Patient.  AWV Questionnaire: No: Patient Medicare AWV questionnaire was not completed prior to this visit.  Cardiac Risk Factors include: advanced age (>63men, >28 women);dyslipidemia;hypertension;obesity (BMI >30kg/m2);sedentary lifestyle     Objective:    Today's Vitals   11/03/23 1518 11/03/23 1519  Weight: 206 lb (93.4 kg)   Height: 5' 2.5 (1.588 m)   PainSc:  0-No pain   Body mass index is 37.08 kg/m.     11/03/2023    3:16 PM 08/19/2023   11:06 AM 12/18/2021   10:21 AM 09/14/2020    8:12 AM  Advanced Directives  Does Patient Have a Medical Advance Directive? No No No No  Would patient like information on creating a medical advance directive? No - Patient declined No - Patient declined Yes (ED - Information included in AVS) No - Patient declined    Current Medications  (verified) Outpatient Encounter Medications as of 11/03/2023  Medication Sig   acetaminophen (TYLENOL) 325 MG tablet Take 650 mg by mouth every 6 (six) hours as needed.   albuterol  (VENTOLIN  HFA) 108 (90 Base) MCG/ACT inhaler INHALE 1 PUFF INTO THE LUNGS EVERY 6 HOURS AS NEEDED FOR WHEEZING OR SHORTNESS OF BREATH.   alendronate (FOSAMAX) 70 MG tablet Take 70 mg by mouth once a week.   aspirin EC 81 MG tablet Take 81 mg by mouth daily. Swallow whole.   atorvastatin (LIPITOR) 10 MG tablet Take 10 mg by mouth daily.   buPROPion  (WELLBUTRIN  XL) 300 MG 24 hr tablet TAKE 1 TABLET BY MOUTH EVERY DAY   Cholecalciferol (D-3-5) 125 MCG (5000 UT) capsule Take 5,000 Units by mouth daily.   fluticasone (FLONASE) 50 MCG/ACT nasal spray USE 2 SPRAYS IN EACH NOSTRIL ONCE A DAY TO PREVENT NASAL ALLERGIES.   hydrocortisone  (ANUSOL -HC) 25 MG suppository Place 1 suppository (25 mg total) rectally 2 (two) times daily.   ibuprofen (ADVIL) 100 MG tablet Take 100 mg by mouth every 6 (six) hours as needed for fever.   LORazepam  (ATIVAN ) 0.5 MG tablet TAKE 2 TABLETS (1 MG TOTAL) BY MOUTH 2 (TWO) TIMES DAILY AS NEEDED FOR ANXIETY   losartan  (COZAAR ) 100 MG tablet TAKE 1 TABLET BY MOUTH EVERY DAY   Omega-3 Fatty Acids (FISH OIL) 1000 MG CAPS Take 1,000 mg by mouth daily.   OVER THE COUNTER MEDICATION Take 2 capsules by mouth daily. Kidney Health and Function   calcium carbonate (OSCAL) 1500 (600 Ca) MG TABS tablet Take 600 mg of elemental calcium by mouth  daily with breakfast. (Patient not taking: Reported on 11/03/2023)   No facility-administered encounter medications on file as of 11/03/2023.    Allergies (verified) Patient has no known allergies.   History: Past Medical History:  Diagnosis Date   Allergy    Anxiety ?   Depression    High cholesterol    Hypertension    Past Surgical History:  Procedure Laterality Date   CHOLECYSTECTOMY     JOINT REPLACEMENT  ?   TONSILLECTOMY     TOTAL HIP ARTHROPLASTY Right     Family History  Problem Relation Age of Onset   Breast cancer Maternal Aunt    Cancer Maternal Aunt    Breast cancer Maternal Uncle    Cancer Maternal Uncle    Arthritis Father    Varicose Veins Sister    Social History   Socioeconomic History   Marital status: Married    Spouse name: Not on file   Number of children: Not on file   Years of education: Not on file   Highest education level: 12th grade  Occupational History   Not on file  Tobacco Use   Smoking status: Former    Types: Cigarettes   Smokeless tobacco: Never  Vaping Use   Vaping status: Never Used  Substance and Sexual Activity   Alcohol use: Never   Drug use: Never   Sexual activity: Not on file  Other Topics Concern   Not on file  Social History Narrative   Not on file   Social Drivers of Health   Financial Resource Strain: Low Risk  (11/03/2023)   Overall Financial Resource Strain (CARDIA)    Difficulty of Paying Living Expenses: Not hard at all  Food Insecurity: No Food Insecurity (11/03/2023)   Hunger Vital Sign    Worried About Running Out of Food in the Last Year: Never true    Ran Out of Food in the Last Year: Never true  Transportation Needs: No Transportation Needs (11/03/2023)   PRAPARE - Administrator, Civil Service (Medical): No    Lack of Transportation (Non-Medical): No  Physical Activity: Inactive (11/03/2023)   Exercise Vital Sign    Days of Exercise per Week: 0 days    Minutes of Exercise per Session: 0 min  Stress: Stress Concern Present (11/03/2023)   [employer] of Occupational Health - Occupational Stress Questionnaire    Feeling of Stress: Rather much  Social Connections: Moderately Isolated (11/03/2023)   Social Connection and Isolation Panel    Frequency of Communication with Friends and Family: More than three times a week    Frequency of Social Gatherings with Friends and Family: More than three times a week    Attends Religious Services: Never    Automotive engineer or Organizations: No    Attends Engineer, structural: Never    Marital Status: Married    Tobacco Counseling Counseling given: Yes    Clinical Intake:  Pre-visit preparation completed: Yes  Pain : 0-10 Pain Score: 0-No pain     BMI - recorded: 37.08 Nutritional Risks: None Diabetes: No  Lab Results  Component Value Date   HGBA1C 5.5 08/04/2023   HGBA1C 5.8 (H) 08/01/2022     How often do you need to have someone help you when you read instructions, pamphlets, or other written materials from your doctor or pharmacy?: 1 - Never  Interpreter Needed?: No  Information entered by :: Stefano ORN [facility]   Activities of Daily  Living     11/03/2023    3:23 PM  In your present state of health, do you have any difficulty performing the following activities:  Hearing? 0  Vision? 0  Difficulty concentrating or making decisions? 0  Walking or climbing stairs? 1  Dressing or bathing? 0  Doing errands, shopping? 0  Preparing Food and eating ? N  Using the Toilet? N  In the past six months, have you accidently leaked urine? N  Do you have problems with loss of bowel control? N  Managing your Medications? N  Managing your Finances? N  Housekeeping or managing your Housekeeping? N    Patient Care Team: Bevely Doffing, FNP as PCP - General (Family Medicine) Margrette Taft BRAVO, MD as Consulting Physician (Orthopedic Surgery) Kristine Ronal BIRCH, RD as Dietitian (Nutrition)  I have updated your Care Teams any recent Medical Services you may have received from other providers in the past year.     Assessment:   This is a routine wellness examination for Gina Salazar.  Hearing/Vision screen Hearing Screening - Comments:: Patient denies any hearing difficulties.   Vision Screening - Comments:: Patient is not up to date on yearly eye exams.  Has an appointment in September to update eye exam.    Goals Addressed             This Visit's Progress     Patient Stated       I want to lose weight.        Depression Screen     11/03/2023    3:31 PM 08/04/2023    9:51 AM 05/14/2023    7:59 AM 04/20/2023    1:06 PM 02/02/2023   10:23 AM 09/02/2022    2:55 PM 08/01/2022    8:30 AM  PHQ 2/9 Scores  PHQ - 2 Score 1 3 0 0 0 5 4  PHQ- 9 Score 2 10 11 11  16 17     Fall Risk     11/03/2023    3:21 PM 08/04/2023    9:51 AM 05/14/2023    7:59 AM 02/02/2023   10:23 AM 09/02/2022    2:55 PM  Fall Risk   Falls in the past year? 0 0 0 0 1  Number falls in past yr: 0 0 0 0 0  Injury with Fall? 0 0 0 0 0  Risk for fall due to : Impaired balance/gait;Impaired mobility No Fall Risks No Fall Risks No Fall Risks No Fall Risks  Follow up Falls prevention discussed;Education provided;Falls evaluation completed Falls evaluation completed;Education provided;Falls prevention discussed Falls evaluation completed Falls evaluation completed Falls evaluation completed    MEDICARE RISK AT HOME:  Medicare Risk at Home Any stairs in or around the home?: Yes If so, are there any without handrails?: No Home free of loose throw rugs in walkways, pet beds, electrical cords, etc?: Yes Adequate lighting in your home to reduce risk of falls?: Yes Life alert?: No Use of a cane, walker or w/c?: No Grab bars in the bathroom?: Yes Shower chair or bench in shower?: Yes Elevated toilet seat or a handicapped toilet?: Yes  TIMED UP AND GO:  Was the test performed?  No  Cognitive Function: 6CIT completed        11/03/2023    3:24 PM  6CIT Screen  What Year? 0 points  What month? 0 points  What time? 0 points  Count back from 20 0 points  Months in reverse 0 points  Repeat phrase  0 points  Total Score 0 points    Immunizations Immunization History  Administered Date(s) Administered   Fluad Trivalent(High Dose 65+) 02/02/2023   PNEUMOCOCCAL CONJUGATE-20 08/01/2022    Screening Tests Health Maintenance  Topic Date Due   COVID-19 Vaccine (1) Never done    DTaP/Tdap/Td (1 - Tdap) Never done   Zoster Vaccines- Shingrix (1 of 2) Never done   MAMMOGRAM  04/17/2022   INFLUENZA VACCINE  10/30/2023   DEXA SCAN  11/26/2023   Medicare Annual Wellness (AWV)  11/02/2024   Pneumococcal Vaccine: 50+ Years  Completed   Hepatitis B Vaccines  Aged Out   HPV VACCINES  Aged Out   Meningococcal B Vaccine  Aged Out   Hepatitis C Screening  Discontinued    Health Maintenance  Health Maintenance Due  Topic Date Due   COVID-19 Vaccine (1) Never done   DTaP/Tdap/Td (1 - Tdap) Never done   Zoster Vaccines- Shingrix (1 of 2) Never done   MAMMOGRAM  04/17/2022   INFLUENZA VACCINE  10/30/2023   Health Maintenance Items Addressed: Mammogram ordered, DEXA ordered  Additional Screening:  Vision Screening: Recommended annual ophthalmology exams for early detection of glaucoma and other disorders of the eye. Would you like a referral to an eye doctor? No  Patient has an appt scheduled with Dr. Waylan  Dental Screening: Recommended annual dental exams for proper oral hygiene  Community Resource Referral / Chronic Care Management: CRR required this visit?  No   CCM required this visit?  No   Plan:    I have personally reviewed and noted the following in the patient's chart:   Medical and social history Use of alcohol, tobacco or illicit drugs  Current medications and supplements including opioid prescriptions. Patient is not currently taking opioid prescriptions. Functional ability and status Nutritional status Physical activity Advanced directives List of other physicians Hospitalizations, surgeries, and ER visits in previous 12 months Vitals Screenings to include cognitive, depression, and falls Referrals and appointments  In addition, I have reviewed and discussed with patient certain preventive protocols, quality metrics, and best practice recommendations. A written personalized care plan for preventive services as well as general preventive  health recommendations were provided to patient.   Larena Ohnemus, CMA   11/03/2023   After Visit Summary: (MyChart) Due to this being a telephonic visit, the after visit summary with patients personalized plan was offered to patient via MyChart   Notes: Nothing significant to report at this time.

## 2023-12-04 ENCOUNTER — Other Ambulatory Visit: Payer: Self-pay

## 2023-12-04 DIAGNOSIS — F419 Anxiety disorder, unspecified: Secondary | ICD-10-CM

## 2024-01-06 ENCOUNTER — Other Ambulatory Visit: Payer: Self-pay

## 2024-01-06 DIAGNOSIS — F32A Depression, unspecified: Secondary | ICD-10-CM

## 2024-01-06 NOTE — Telephone Encounter (Signed)
 Copied from CRM #[id number]. Topic: Clinical - Medication Refill >> Jan 06, 2024  3:11 PM Suzette B wrote: Medication: LORazepam  (ATIVAN ) 0.5 MG tablet  Has the patient contacted their pharmacy? No due to the discrepancy in the amount of medication she wanted to call in to the office first    This is the patient's preferred pharmacy:  [id number] - [place], [place] - [address] AT [facility] [address] [place] [address] Phone: [phone] Fax: [phone]  Is this the correct pharmacy for this prescription? Yes If no, delete pharmacy and type the correct one.   Has the prescription been filled recently? Yes  Is the patient out of the medication? No- patient states she has four pills left   Has the patient been seen for an appointment in the last year OR does the patient have an upcoming appointment? Yes  Can we respond through MyChart? Yes  Agent: Please be advised that Rx refills may take up to 3 business days. We ask that you follow-up with your pharmacy.

## 2024-01-07 MED ORDER — LORAZEPAM 1 MG PO TABS
1.0000 mg | ORAL_TABLET | Freq: Two times a day (BID) | ORAL | 0 refills | Status: DC | PRN
Start: 2024-01-07 — End: 2024-02-22

## 2024-02-04 ENCOUNTER — Ambulatory Visit

## 2024-02-05 ENCOUNTER — Other Ambulatory Visit (HOSPITAL_COMMUNITY)

## 2024-02-05 ENCOUNTER — Ambulatory Visit (HOSPITAL_COMMUNITY)

## 2024-02-19 ENCOUNTER — Other Ambulatory Visit: Payer: Self-pay

## 2024-02-19 DIAGNOSIS — F32A Depression, unspecified: Secondary | ICD-10-CM

## 2024-02-29 ENCOUNTER — Other Ambulatory Visit (HOSPITAL_COMMUNITY)

## 2024-02-29 ENCOUNTER — Ambulatory Visit (HOSPITAL_COMMUNITY)

## 2024-03-17 ENCOUNTER — Other Ambulatory Visit: Payer: Self-pay

## 2024-03-21 ENCOUNTER — Other Ambulatory Visit: Payer: Self-pay | Admitting: Internal Medicine

## 2024-03-21 ENCOUNTER — Other Ambulatory Visit: Payer: Self-pay

## 2024-03-21 DIAGNOSIS — F419 Anxiety disorder, unspecified: Secondary | ICD-10-CM

## 2024-03-21 NOTE — Progress Notes (Unsigned)
" ° °  Chief Complaint: Leaking urine  History of Present Illness:    Past Medical History:  Past Medical History:  Diagnosis Date   Allergy    Anxiety ?   Depression    High cholesterol    Hypertension     Past Surgical History:  Past Surgical History:  Procedure Laterality Date   CHOLECYSTECTOMY     JOINT REPLACEMENT  ?   TONSILLECTOMY     TOTAL HIP ARTHROPLASTY Right     Allergies:  Allergies[1]  Family History:  Family History  Problem Relation Age of Onset   Breast cancer Maternal Aunt    Cancer Maternal Aunt    Breast cancer Maternal Uncle    Cancer Maternal Uncle    Arthritis Father    Varicose Veins Sister     Social History:  Social History[2]  Review of symptoms:  Constitutional:  Negative for unexplained weight loss, night sweats, fever, chills ENT:  Negative for nose bleeds, sinus pain, painful swallowing CV:  Negative for chest pain, shortness of breath, exercise intolerance, palpitations, loss of consciousness Resp:  Negative for cough, wheezing, shortness of breath GI:  Negative for nausea, vomiting, diarrhea, bloody stools GU:  Positives noted in HPI; otherwise negative for gross hematuria, dysuria, urinary incontinence Neuro:  Negative for seizures, poor balance, limb weakness, slurred speech Psych:  Negative for lack of energy, depression, anxiety Endocrine:  Negative for polydipsia, polyuria, symptoms of hypoglycemia (dizziness, hunger, sweating) Hematologic:  Negative for anemia, purpura, petechia, prolonged or excessive bleeding, use of anticoagulants  Allergic:  Negative for difficulty breathing or choking as a result of exposure to anything; no shellfish allergy; no allergic response (rash/itch) to materials, foods  Physical exam: There were no vitals taken for this visit. GENERAL APPEARANCE:  Well appearing, well developed, well nourished, NAD HEENT: Atraumatic, Normocephalic, oropharynx clear. NECK: Supple without lymphadenopathy or  thyromegaly. LUNGS: Clear to auscultation bilaterally. HEART: Regular Rate and Rhythm without murmurs, gallops, or rubs. ABDOMEN: Soft, non-tender, No Masses. EXTREMITIES: Moves all extremities well.  Without clubbing, cyanosis, or edema. NEUROLOGIC:  Alert and oriented x 3, normal gait, CN II-XII grossly intact.  MENTAL STATUS:  Appropriate. BACK:  Non-tender to palpation.  No CVAT SKIN:  Warm, dry and intact.    Results: No results found for this or any previous visit (from the past 24 hours).  I have reviewed prior patient's records  I have reviewed referring/prior physicians records  I have reviewed urinalysis  I have reviewed prior urine cultures  I reviewed prior imaging studies  Assessment: No diagnosis found.   Plan: ***     [1] No Known Allergies [2]  Social History Tobacco Use   Smoking status: Former    Types: Cigarettes   Smokeless tobacco: Never  Vaping Use   Vaping status: Never Used  Substance Use Topics   Alcohol use: Never   Drug use: Never   "

## 2024-03-22 ENCOUNTER — Ambulatory Visit: Admitting: Urology

## 2024-03-22 VITALS — BP 128/77 | HR 96

## 2024-03-22 DIAGNOSIS — N3941 Urge incontinence: Secondary | ICD-10-CM | POA: Diagnosis not present

## 2024-03-22 DIAGNOSIS — R32 Unspecified urinary incontinence: Secondary | ICD-10-CM

## 2024-03-22 LAB — BLADDER SCAN AMB NON-IMAGING: Scan Result: 0

## 2024-03-22 NOTE — Progress Notes (Signed)
 Bladder Scan completed today due to reason of urinary incontinence     Patient can void prior to the bladder scan. Bladder scan result: 0  Performed By: Exie T. CMA  Additional notes- Patient is scheduled to follow up with MD

## 2024-03-23 LAB — URINALYSIS, ROUTINE W REFLEX MICROSCOPIC
Bilirubin, UA: NEGATIVE
Glucose, UA: NEGATIVE
Ketones, UA: NEGATIVE
Nitrite, UA: NEGATIVE
Protein,UA: NEGATIVE
RBC, UA: NEGATIVE
Specific Gravity, UA: 1.02 (ref 1.005–1.030)
Urobilinogen, Ur: 0.2 mg/dL (ref 0.2–1.0)
pH, UA: 6 (ref 5.0–7.5)

## 2024-03-23 LAB — MICROSCOPIC EXAMINATION: Bacteria, UA: NONE SEEN

## 2024-04-08 ENCOUNTER — Other Ambulatory Visit: Payer: Self-pay

## 2024-04-29 ENCOUNTER — Other Ambulatory Visit: Payer: Self-pay

## 2024-05-02 ENCOUNTER — Encounter: Payer: Self-pay | Admitting: Urology

## 2024-05-03 ENCOUNTER — Telehealth: Payer: Self-pay | Admitting: Urology

## 2024-05-03 ENCOUNTER — Other Ambulatory Visit: Payer: Self-pay | Admitting: Urology

## 2024-05-03 DIAGNOSIS — R32 Unspecified urinary incontinence: Secondary | ICD-10-CM

## 2024-05-03 MED ORDER — SOLIFENACIN SUCCINATE 10 MG PO TABS: 10.0000 mg | ORAL_TABLET | Freq: Every day | ORAL | 11 refills | Status: AC

## 2024-05-03 NOTE — Telephone Encounter (Signed)
 Return call to patient and her concerns have been addressed. Voiced understanding

## 2024-05-03 NOTE — Telephone Encounter (Signed)
 Please send in generic Rx

## 2024-05-30 ENCOUNTER — Ambulatory Visit: Payer: Self-pay

## 2024-11-07 ENCOUNTER — Ambulatory Visit
